# Patient Record
Sex: Male | Born: 1945
Health system: Southern US, Community
[De-identification: ages and names within clinical notes are randomized; demographics above are authoritative.]

## PROBLEM LIST (undated history)

## (undated) DIAGNOSIS — F419 Anxiety disorder, unspecified: Secondary | ICD-10-CM

## (undated) DIAGNOSIS — J309 Allergic rhinitis, unspecified: Secondary | ICD-10-CM

## (undated) DIAGNOSIS — E785 Hyperlipidemia, unspecified: Secondary | ICD-10-CM

## (undated) DIAGNOSIS — H409 Unspecified glaucoma: Secondary | ICD-10-CM

## (undated) DIAGNOSIS — C61 Malignant neoplasm of prostate: Secondary | ICD-10-CM

## (undated) DIAGNOSIS — I10 Essential (primary) hypertension: Secondary | ICD-10-CM

## (undated) DIAGNOSIS — K219 Gastro-esophageal reflux disease without esophagitis: Secondary | ICD-10-CM

## (undated) HISTORY — PX: TONSILLECTOMY: SUR1361

## (undated) HISTORY — DX: Hyperlipidemia, unspecified: E78.5

## (undated) HISTORY — PX: EYE SURGERY: SHX253

## (undated) HISTORY — DX: Essential (primary) hypertension: I10

## (undated) HISTORY — DX: Unspecified glaucoma: H40.9

## (undated) HISTORY — DX: Malignant neoplasm of prostate: C61

## (undated) HISTORY — PX: VASECTOMY: SHX75

## (undated) HISTORY — DX: Gastro-esophageal reflux disease without esophagitis: K21.9

## (undated) HISTORY — DX: Allergic rhinitis, unspecified: J30.9

---

## 2011-11-27 DIAGNOSIS — H40019 Open angle with borderline findings, low risk, unspecified eye: Secondary | ICD-10-CM | POA: Diagnosis not present

## 2012-01-06 DIAGNOSIS — L218 Other seborrheic dermatitis: Secondary | ICD-10-CM | POA: Diagnosis not present

## 2012-01-06 DIAGNOSIS — L821 Other seborrheic keratosis: Secondary | ICD-10-CM | POA: Diagnosis not present

## 2012-01-06 DIAGNOSIS — D1801 Hemangioma of skin and subcutaneous tissue: Secondary | ICD-10-CM | POA: Diagnosis not present

## 2012-01-06 DIAGNOSIS — L570 Actinic keratosis: Secondary | ICD-10-CM | POA: Diagnosis not present

## 2012-01-06 DIAGNOSIS — L819 Disorder of pigmentation, unspecified: Secondary | ICD-10-CM | POA: Diagnosis not present

## 2012-01-06 DIAGNOSIS — D239 Other benign neoplasm of skin, unspecified: Secondary | ICD-10-CM | POA: Diagnosis not present

## 2012-01-13 DIAGNOSIS — Z0000 Encounter for general adult medical examination without abnormal findings: Secondary | ICD-10-CM | POA: Diagnosis not present

## 2012-01-13 DIAGNOSIS — Z789 Other specified health status: Secondary | ICD-10-CM | POA: Diagnosis not present

## 2012-01-13 DIAGNOSIS — E785 Hyperlipidemia, unspecified: Secondary | ICD-10-CM | POA: Diagnosis not present

## 2012-01-13 DIAGNOSIS — K219 Gastro-esophageal reflux disease without esophagitis: Secondary | ICD-10-CM | POA: Diagnosis not present

## 2012-01-13 DIAGNOSIS — Z713 Dietary counseling and surveillance: Secondary | ICD-10-CM | POA: Diagnosis not present

## 2012-02-03 DIAGNOSIS — Z23 Encounter for immunization: Secondary | ICD-10-CM | POA: Diagnosis not present

## 2012-04-14 DIAGNOSIS — Z86010 Personal history of colonic polyps: Secondary | ICD-10-CM | POA: Diagnosis not present

## 2012-04-14 DIAGNOSIS — K5730 Diverticulosis of large intestine without perforation or abscess without bleeding: Secondary | ICD-10-CM | POA: Diagnosis not present

## 2012-04-14 DIAGNOSIS — D126 Benign neoplasm of colon, unspecified: Secondary | ICD-10-CM | POA: Diagnosis not present

## 2012-04-14 DIAGNOSIS — Z1211 Encounter for screening for malignant neoplasm of colon: Secondary | ICD-10-CM | POA: Diagnosis not present

## 2012-05-25 DIAGNOSIS — E785 Hyperlipidemia, unspecified: Secondary | ICD-10-CM | POA: Diagnosis not present

## 2012-05-25 DIAGNOSIS — R030 Elevated blood-pressure reading, without diagnosis of hypertension: Secondary | ICD-10-CM | POA: Diagnosis not present

## 2012-07-02 DIAGNOSIS — C61 Malignant neoplasm of prostate: Secondary | ICD-10-CM | POA: Diagnosis not present

## 2012-07-02 DIAGNOSIS — E785 Hyperlipidemia, unspecified: Secondary | ICD-10-CM | POA: Diagnosis not present

## 2012-07-13 DIAGNOSIS — E785 Hyperlipidemia, unspecified: Secondary | ICD-10-CM | POA: Diagnosis not present

## 2012-07-13 DIAGNOSIS — M1990 Unspecified osteoarthritis, unspecified site: Secondary | ICD-10-CM | POA: Diagnosis not present

## 2012-07-13 DIAGNOSIS — Z789 Other specified health status: Secondary | ICD-10-CM | POA: Diagnosis not present

## 2012-07-13 DIAGNOSIS — K219 Gastro-esophageal reflux disease without esophagitis: Secondary | ICD-10-CM | POA: Diagnosis not present

## 2012-07-13 DIAGNOSIS — Z713 Dietary counseling and surveillance: Secondary | ICD-10-CM | POA: Diagnosis not present

## 2012-08-06 DIAGNOSIS — R030 Elevated blood-pressure reading, without diagnosis of hypertension: Secondary | ICD-10-CM | POA: Diagnosis not present

## 2012-08-11 DIAGNOSIS — Z789 Other specified health status: Secondary | ICD-10-CM | POA: Diagnosis not present

## 2012-08-11 DIAGNOSIS — R030 Elevated blood-pressure reading, without diagnosis of hypertension: Secondary | ICD-10-CM | POA: Diagnosis not present

## 2012-08-11 DIAGNOSIS — Z713 Dietary counseling and surveillance: Secondary | ICD-10-CM | POA: Diagnosis not present

## 2012-09-29 DIAGNOSIS — E785 Hyperlipidemia, unspecified: Secondary | ICD-10-CM | POA: Diagnosis not present

## 2012-09-29 DIAGNOSIS — I10 Essential (primary) hypertension: Secondary | ICD-10-CM | POA: Diagnosis not present

## 2012-10-13 DIAGNOSIS — H6120 Impacted cerumen, unspecified ear: Secondary | ICD-10-CM | POA: Diagnosis not present

## 2013-01-04 DIAGNOSIS — Z23 Encounter for immunization: Secondary | ICD-10-CM | POA: Diagnosis not present

## 2013-01-14 DIAGNOSIS — R030 Elevated blood-pressure reading, without diagnosis of hypertension: Secondary | ICD-10-CM | POA: Diagnosis not present

## 2013-01-14 DIAGNOSIS — C61 Malignant neoplasm of prostate: Secondary | ICD-10-CM | POA: Diagnosis not present

## 2013-01-18 DIAGNOSIS — I10 Essential (primary) hypertension: Secondary | ICD-10-CM | POA: Diagnosis not present

## 2013-01-18 DIAGNOSIS — K219 Gastro-esophageal reflux disease without esophagitis: Secondary | ICD-10-CM | POA: Diagnosis not present

## 2013-01-18 DIAGNOSIS — E785 Hyperlipidemia, unspecified: Secondary | ICD-10-CM | POA: Diagnosis not present

## 2013-01-18 DIAGNOSIS — Z0000 Encounter for general adult medical examination without abnormal findings: Secondary | ICD-10-CM | POA: Diagnosis not present

## 2013-01-27 DIAGNOSIS — H40019 Open angle with borderline findings, low risk, unspecified eye: Secondary | ICD-10-CM | POA: Diagnosis not present

## 2013-05-19 DIAGNOSIS — E785 Hyperlipidemia, unspecified: Secondary | ICD-10-CM | POA: Diagnosis not present

## 2013-05-19 DIAGNOSIS — K219 Gastro-esophageal reflux disease without esophagitis: Secondary | ICD-10-CM | POA: Diagnosis not present

## 2013-05-19 DIAGNOSIS — I10 Essential (primary) hypertension: Secondary | ICD-10-CM | POA: Diagnosis not present

## 2013-07-28 DIAGNOSIS — H35039 Hypertensive retinopathy, unspecified eye: Secondary | ICD-10-CM | POA: Diagnosis not present

## 2013-07-28 DIAGNOSIS — H40019 Open angle with borderline findings, low risk, unspecified eye: Secondary | ICD-10-CM | POA: Diagnosis not present

## 2013-07-28 DIAGNOSIS — H2510 Age-related nuclear cataract, unspecified eye: Secondary | ICD-10-CM | POA: Diagnosis not present

## 2013-07-28 DIAGNOSIS — H43819 Vitreous degeneration, unspecified eye: Secondary | ICD-10-CM | POA: Diagnosis not present

## 2013-08-02 DIAGNOSIS — D239 Other benign neoplasm of skin, unspecified: Secondary | ICD-10-CM | POA: Diagnosis not present

## 2013-08-02 DIAGNOSIS — L570 Actinic keratosis: Secondary | ICD-10-CM | POA: Diagnosis not present

## 2013-08-02 DIAGNOSIS — L819 Disorder of pigmentation, unspecified: Secondary | ICD-10-CM | POA: Diagnosis not present

## 2013-08-02 DIAGNOSIS — L821 Other seborrheic keratosis: Secondary | ICD-10-CM | POA: Diagnosis not present

## 2013-08-02 DIAGNOSIS — L218 Other seborrheic dermatitis: Secondary | ICD-10-CM | POA: Diagnosis not present

## 2013-08-02 DIAGNOSIS — D1801 Hemangioma of skin and subcutaneous tissue: Secondary | ICD-10-CM | POA: Diagnosis not present

## 2013-08-18 DIAGNOSIS — I10 Essential (primary) hypertension: Secondary | ICD-10-CM | POA: Diagnosis not present

## 2013-08-18 DIAGNOSIS — E785 Hyperlipidemia, unspecified: Secondary | ICD-10-CM | POA: Diagnosis not present

## 2013-08-18 DIAGNOSIS — C61 Malignant neoplasm of prostate: Secondary | ICD-10-CM | POA: Diagnosis not present

## 2013-08-18 DIAGNOSIS — K219 Gastro-esophageal reflux disease without esophagitis: Secondary | ICD-10-CM | POA: Diagnosis not present

## 2013-08-30 DIAGNOSIS — H40019 Open angle with borderline findings, low risk, unspecified eye: Secondary | ICD-10-CM | POA: Diagnosis not present

## 2013-08-30 DIAGNOSIS — H43819 Vitreous degeneration, unspecified eye: Secondary | ICD-10-CM | POA: Diagnosis not present

## 2013-08-30 DIAGNOSIS — H2510 Age-related nuclear cataract, unspecified eye: Secondary | ICD-10-CM | POA: Diagnosis not present

## 2013-08-30 DIAGNOSIS — H35039 Hypertensive retinopathy, unspecified eye: Secondary | ICD-10-CM | POA: Diagnosis not present

## 2013-10-24 DIAGNOSIS — H4011X0 Primary open-angle glaucoma, stage unspecified: Secondary | ICD-10-CM | POA: Diagnosis not present

## 2013-10-24 DIAGNOSIS — H409 Unspecified glaucoma: Secondary | ICD-10-CM | POA: Diagnosis not present

## 2013-11-17 DIAGNOSIS — Z23 Encounter for immunization: Secondary | ICD-10-CM | POA: Diagnosis not present

## 2013-11-17 DIAGNOSIS — E782 Mixed hyperlipidemia: Secondary | ICD-10-CM | POA: Diagnosis not present

## 2013-11-17 DIAGNOSIS — K219 Gastro-esophageal reflux disease without esophagitis: Secondary | ICD-10-CM | POA: Diagnosis not present

## 2013-11-17 DIAGNOSIS — I10 Essential (primary) hypertension: Secondary | ICD-10-CM | POA: Diagnosis not present

## 2013-11-17 DIAGNOSIS — H409 Unspecified glaucoma: Secondary | ICD-10-CM | POA: Diagnosis not present

## 2013-11-17 DIAGNOSIS — C61 Malignant neoplasm of prostate: Secondary | ICD-10-CM | POA: Diagnosis not present

## 2014-01-31 DIAGNOSIS — Z23 Encounter for immunization: Secondary | ICD-10-CM | POA: Diagnosis not present

## 2014-03-01 DIAGNOSIS — H40033 Anatomical narrow angle, bilateral: Secondary | ICD-10-CM | POA: Diagnosis not present

## 2014-03-16 ENCOUNTER — Ambulatory Visit: Payer: Self-pay | Admitting: Cardiology

## 2014-03-20 ENCOUNTER — Ambulatory Visit: Payer: Self-pay | Admitting: Cardiology

## 2014-04-03 ENCOUNTER — Encounter: Payer: Self-pay | Admitting: Cardiology

## 2014-04-03 ENCOUNTER — Ambulatory Visit (INDEPENDENT_AMBULATORY_CARE_PROVIDER_SITE_OTHER): Payer: Medicare Other | Admitting: Cardiology

## 2014-04-03 VITALS — BP 140/80 | HR 93 | Ht 68.0 in | Wt 151.8 lb

## 2014-04-03 DIAGNOSIS — J309 Allergic rhinitis, unspecified: Secondary | ICD-10-CM | POA: Insufficient documentation

## 2014-04-03 DIAGNOSIS — I10 Essential (primary) hypertension: Secondary | ICD-10-CM | POA: Diagnosis not present

## 2014-04-03 DIAGNOSIS — C61 Malignant neoplasm of prostate: Secondary | ICD-10-CM | POA: Insufficient documentation

## 2014-04-03 DIAGNOSIS — H409 Unspecified glaucoma: Secondary | ICD-10-CM | POA: Insufficient documentation

## 2014-04-03 DIAGNOSIS — E785 Hyperlipidemia, unspecified: Secondary | ICD-10-CM | POA: Insufficient documentation

## 2014-04-03 DIAGNOSIS — R9431 Abnormal electrocardiogram [ECG] [EKG]: Secondary | ICD-10-CM

## 2014-04-03 DIAGNOSIS — K219 Gastro-esophageal reflux disease without esophagitis: Secondary | ICD-10-CM | POA: Insufficient documentation

## 2014-04-03 NOTE — Patient Instructions (Addendum)
Your physician has requested that you have a Stress Myoview.   Your physician has requested that you have an echocardiogram. Echocardiography is a painless test that uses sound waves to create images of your heart. It provides your doctor with information about the size and shape of your heart and how well your heart's chambers and valves are working. This procedure takes approximately one hour. There are no restrictions for this procedure.  Your physician recommends that you continue on your current medications as directed. Please refer to the Current Medication list given to you today.  Your physician recommends that you schedule a follow-up appointment AS NEEDED with Dr. Radford Pax.

## 2014-04-03 NOTE — Progress Notes (Signed)
  [place], [place], [place]  [address] Phone: [phone] Fax:  [phone]  Date:  04/03/2014   ID:  Austin Guerrero, DOB 1946-03-01, MRN [id number]  PCP:  Dr. Theadore Nan Cardiologist:  Fransico Him, MD    History of Present Illness: Austin Guerrero is a 68 y.o. male with a history of HTN and dyslipidemia who presents today for evaluation of cardiac risk factors.  His mother had tachycardia and pericardial effusion and CHF and his dad had HTN.  There is no family history of heart attacks that he is aware of.  He denies any chest pain, SOB, DOE, LE edema, dizziness, palpitations or syncope.     Wt Readings from Last 3 Encounters:  04/03/14 151 lb 12.8 oz (68.856 kg)     Past Medical History  Diagnosis Date  . Hypertension   . Hyperlipidemia   . Prostate cancer   . GERD (gastroesophageal reflux disease)   . Allergic rhinitis   . Glaucoma     Current Outpatient Prescriptions  Medication Sig Dispense Refill  . aspirin 81 MG tablet Take 81 mg by mouth daily.    Marland Kitchen atorvastatin (LIPITOR) 20 MG tablet     . Calcium Carbonate-Vitamin D (CALCIUM 600+D) 600-400 MG-UNIT per tablet Take 1 tablet by mouth daily.    . hydrochlorothiazide (HYDRODIURIL) 25 MG tablet     . Multiple Vitamin (MULTIVITAMIN) tablet Take 1 tablet by mouth daily.    Marland Kitchen omeprazole (PRILOSEC) 20 MG capsule     . desloratadine (CLARINEX) 5 MG tablet Take 5 mg by mouth as needed.     No current facility-administered medications for this visit.    Allergies:   No Known Allergies  Social History:  The patient  reports that he has never smoked. He does not have any smokeless tobacco history on file. He reports that he drinks alcohol. He reports that he does not use illicit drugs.   Family History:  The patient's family history includes Arrhythmia in his father; Atrial fibrillation in his mother; Cancer - Lung in his father; Heart failure in his mother; Hyperlipidemia in his brother; Hypertension in his  brother; Prostate cancer in his father.   ROS:  Please see the history of present illness.      All other systems reviewed and negative.   PHYSICAL EXAM: VS:  BP 140/80 mmHg  Pulse 93  Ht 5\' 8"  (1.727 m)  Wt 151 lb 12.8 oz (68.856 kg)  BMI 23.09 kg/m2 Well nourished, well developed, in no acute distress HEENT: normal Neck: no JVD Cardiac:  normal S1, S2; RRR; no murmur Lungs:  clear to auscultation bilaterally, no wheezing, rhonchi or rales Abd: soft, nontender, no hepatomegaly Ext: no edema Skin: warm and dry Neuro:  CNs 2-12 intact, no focal abnormalities noted  EKG:     NSR with possible inferolateral infarct and nonspecific T wave abnormality  ASSESSMENT AND PLAN:  1. HTN well controlled 2. Dyslipidemia 3. Abnormal EKG with inferolateral infarct pattern.  He has a Family history of CHF and arrhythmia.  He has no evidence of CHF on exam and has no symptoms of CHF.  There is no family history of CAD that he is aware of.  I will get a stress myoview to rule out ischemia and a 2D echo to assess LVF and rule out RWMA's.  Followup PRN pending results of studies  Signed, Fransico Him, MD [facility] 04/03/2014 2:34 PM

## 2014-04-04 ENCOUNTER — Telehealth: Payer: Self-pay | Admitting: Cardiology

## 2014-04-04 NOTE — Telephone Encounter (Signed)
Patient complaining because he has received 4 calls from our office today. He explained that 2 were reminders for upcoming appointments, one was a person calling from nuc med giving instructions for stress test, and one was me.  Instructed the patient that reminder calls are made for all appointments, and it is unfortunate that his appointments are happening so close because he will receive multiple calls for reminders and instruction. Also instructed the patient that I called simply because he asked for a return call.  Apologized for any frustration caused and also warned the patient that another reminder call may be made for his ECHO appointment Friday.

## 2014-04-04 NOTE — Telephone Encounter (Signed)
New Msg  Please contact pt at [phone]. Patient returning call. Sates he's been called multiple times today and isn't sure why. Informed pt of two upcoming appts but,  pt states he feels more comfortable with nurse calling to make sure there are no other concerns.

## 2014-04-05 ENCOUNTER — Ambulatory Visit (HOSPITAL_COMMUNITY): Payer: Medicare Other | Attending: Cardiology | Admitting: Radiology

## 2014-04-05 DIAGNOSIS — R9431 Abnormal electrocardiogram [ECG] [EKG]: Secondary | ICD-10-CM | POA: Insufficient documentation

## 2014-04-05 DIAGNOSIS — R002 Palpitations: Secondary | ICD-10-CM | POA: Insufficient documentation

## 2014-04-05 DIAGNOSIS — I10 Essential (primary) hypertension: Secondary | ICD-10-CM | POA: Diagnosis not present

## 2014-04-05 MED ORDER — TECHNETIUM TC 99M SESTAMIBI GENERIC - CARDIOLITE
11.0000 | Freq: Once | INTRAVENOUS | Status: AC | PRN
  Administered 2014-04-05: 11 via INTRAVENOUS

## 2014-04-05 MED ORDER — TECHNETIUM TC 99M SESTAMIBI GENERIC - CARDIOLITE
33.0000 | Freq: Once | INTRAVENOUS | Status: AC | PRN
  Administered 2014-04-05: 33 via INTRAVENOUS

## 2014-04-05 NOTE — Progress Notes (Signed)
[facility] [address] [place], [place] [address] [phone]    /Cardiology Nuclear Med Study  Austin Guerrero is a 68 y.o. male     MRN : [id number]     DOB: 02-04-1946  Procedure Date: 04/05/2014  Nuclear Med Background Indication for Stress Test:  Evaluation for Ischemia and Abnormal EKG History:  n/a Cardiac Risk Factors: Hypertension  Symptoms:  Palpitations   Nuclear Pre-Procedure Caffeine/Decaff Intake:  None> 12 hrs NPO After: 8:00pm   Lungs:  clear O2 Sat: 97% on room air. IV 0.9% NS with Angio Cath:  22g  IV Site: R Antecubital x 1, tolerated well IV Started by:  Irven Baltimore, RN  Chest Size (in):  38 Cup Size: n/a  Height: 5\' 8"  (1.727 m)  Weight:  148 lb (67.132 kg)  BMI:  Body mass index is 22.51 kg/(m^2). Tech Comments:  N/A    Nuclear Med Study 1 or 2 day study: 1 day  Stress Test Type:  Stress  Reading MD: N/A  Order Authorizing Provider:  Fransico Him, PAC  Resting Radionuclide: Technetium 73m Sestamibi  Resting Radionuclide Dose: 11.0 mCi   Stress Radionuclide:  Technetium 69m Sestamibi  Stress Radionuclide Dose: 33.0 mCi           Stress Protocol Rest HR: 62 Stress HR: 151  Rest BP: 151/82 Stress BP: 197/87  Exercise Time (min): 9:00 METS: 10.10   Predicted Max HR: 150 bpm % Max HR: 100.67 bpm Rate Pressure Product: [phone]   Dose of Adenosine (mg):  n/a Dose of Lexiscan: n/a mg  Dose of Atropine (mg): n/a Dose of Dobutamine: n/a mcg/kg/min (at max HR)  Stress Test Technologist: Perrin Maltese, EMT-P  Nuclear Technologist:  Earl Many, CNMT     Rest Procedure:  Myocardial perfusion imaging was performed at rest 45 minutes following the intravenous administration of Technetium 84m Sestamibi. Rest ECG: Normal sinus rhythm. Normal EKG.  Stress Procedure:  The patient exercised on the treadmill utilizing the Bruce Protocol for 9:00 minutes. The patient stopped due to fatigue and denied any chest pain.   Technetium 23m Sestamibi was injected at peak exercise and myocardial perfusion imaging was performed after a brief delay. Stress ECG: No significant change from baseline ECG  QPS Raw Data Images:  Normal; no motion artifact; normal heart/lung ratio. Stress Images:  Normal homogeneous uptake in all areas of the myocardium. Rest Images:  Normal homogeneous uptake in all areas of the myocardium. Subtraction (SDS):  No evidence of ischemia. Transient Ischemic Dilatation (Normal <1.22):  0.93 Lung/Heart Ratio (Normal <0.45):  0.30  Quantitative Gated Spect Images QGS EDV:  81 ml QGS ESV:  30 ml  Impression Exercise Capacity:  Very good exercise tolerance. BP Response:  Normal blood pressure response. Clinical Symptoms:  Fatigue ECG Impression:  No significant ST segment change suggestive of ischemia. Comparison with Prior Nuclear Study: No images to compare  Overall Impression:  Normal stress nuclear study. This is a low risk scan. There is no scar or ischemia. There is very good exercise tolerance.  LV Ejection Fraction: 63%.  LV Wall Motion:  Normal Wall Motion.  [name]

## 2014-04-07 ENCOUNTER — Ambulatory Visit (HOSPITAL_COMMUNITY): Payer: Medicare Other | Attending: Cardiology | Admitting: Radiology

## 2014-04-07 DIAGNOSIS — R9431 Abnormal electrocardiogram [ECG] [EKG]: Secondary | ICD-10-CM | POA: Diagnosis not present

## 2014-04-07 NOTE — Progress Notes (Signed)
Echocardiogram performed.  

## 2014-04-13 ENCOUNTER — Telehealth: Payer: Self-pay | Admitting: Cardiology

## 2014-04-13 DIAGNOSIS — H40033 Anatomical narrow angle, bilateral: Secondary | ICD-10-CM | POA: Diagnosis not present

## 2014-04-13 NOTE — Telephone Encounter (Signed)
New message ° ° °Pt calling back for echo results  °

## 2014-04-13 NOTE — Telephone Encounter (Signed)
F/U  Pt returning call about echo results please contact at [phone].

## 2014-04-13 NOTE — Telephone Encounter (Signed)
I attempted to call the patient back. I left him a message we will try back tomorrow.

## 2014-04-14 NOTE — Telephone Encounter (Signed)
Follow up ° ° ° ° ° °Calling to get test results °

## 2014-04-14 NOTE — Telephone Encounter (Signed)
I spoke with the patient. 

## 2014-05-19 DIAGNOSIS — C61 Malignant neoplasm of prostate: Secondary | ICD-10-CM | POA: Diagnosis not present

## 2014-05-19 DIAGNOSIS — I10 Essential (primary) hypertension: Secondary | ICD-10-CM | POA: Diagnosis not present

## 2014-05-19 DIAGNOSIS — J309 Allergic rhinitis, unspecified: Secondary | ICD-10-CM | POA: Diagnosis not present

## 2014-05-19 DIAGNOSIS — H409 Unspecified glaucoma: Secondary | ICD-10-CM | POA: Diagnosis not present

## 2014-05-19 DIAGNOSIS — E785 Hyperlipidemia, unspecified: Secondary | ICD-10-CM | POA: Diagnosis not present

## 2014-05-22 DIAGNOSIS — J309 Allergic rhinitis, unspecified: Secondary | ICD-10-CM | POA: Diagnosis not present

## 2014-05-22 DIAGNOSIS — C61 Malignant neoplasm of prostate: Secondary | ICD-10-CM | POA: Diagnosis not present

## 2014-05-22 DIAGNOSIS — R972 Elevated prostate specific antigen [PSA]: Secondary | ICD-10-CM | POA: Diagnosis not present

## 2014-05-22 DIAGNOSIS — I10 Essential (primary) hypertension: Secondary | ICD-10-CM | POA: Diagnosis not present

## 2014-05-22 DIAGNOSIS — E785 Hyperlipidemia, unspecified: Secondary | ICD-10-CM | POA: Diagnosis not present

## 2014-05-22 DIAGNOSIS — Z0000 Encounter for general adult medical examination without abnormal findings: Secondary | ICD-10-CM | POA: Diagnosis not present

## 2014-05-22 DIAGNOSIS — H409 Unspecified glaucoma: Secondary | ICD-10-CM | POA: Diagnosis not present

## 2014-07-13 DIAGNOSIS — H40009 Preglaucoma, unspecified, unspecified eye: Secondary | ICD-10-CM | POA: Diagnosis not present

## 2014-07-20 DIAGNOSIS — H4011X1 Primary open-angle glaucoma, mild stage: Secondary | ICD-10-CM | POA: Diagnosis not present

## 2014-08-22 DIAGNOSIS — Z0000 Encounter for general adult medical examination without abnormal findings: Secondary | ICD-10-CM | POA: Diagnosis not present

## 2014-08-22 DIAGNOSIS — R972 Elevated prostate specific antigen [PSA]: Secondary | ICD-10-CM | POA: Diagnosis not present

## 2014-08-22 DIAGNOSIS — J309 Allergic rhinitis, unspecified: Secondary | ICD-10-CM | POA: Diagnosis not present

## 2014-08-22 DIAGNOSIS — I10 Essential (primary) hypertension: Secondary | ICD-10-CM | POA: Diagnosis not present

## 2014-08-22 DIAGNOSIS — C61 Malignant neoplasm of prostate: Secondary | ICD-10-CM | POA: Diagnosis not present

## 2014-08-22 DIAGNOSIS — H409 Unspecified glaucoma: Secondary | ICD-10-CM | POA: Diagnosis not present

## 2014-08-22 DIAGNOSIS — E785 Hyperlipidemia, unspecified: Secondary | ICD-10-CM | POA: Diagnosis not present

## 2014-10-05 DIAGNOSIS — D225 Melanocytic nevi of trunk: Secondary | ICD-10-CM | POA: Diagnosis not present

## 2014-10-05 DIAGNOSIS — D1801 Hemangioma of skin and subcutaneous tissue: Secondary | ICD-10-CM | POA: Diagnosis not present

## 2014-10-05 DIAGNOSIS — L821 Other seborrheic keratosis: Secondary | ICD-10-CM | POA: Diagnosis not present

## 2014-10-05 DIAGNOSIS — L812 Freckles: Secondary | ICD-10-CM | POA: Diagnosis not present

## 2014-10-05 DIAGNOSIS — L570 Actinic keratosis: Secondary | ICD-10-CM | POA: Diagnosis not present

## 2014-10-05 DIAGNOSIS — Z1283 Encounter for screening for malignant neoplasm of skin: Secondary | ICD-10-CM | POA: Diagnosis not present

## 2014-12-19 DIAGNOSIS — C61 Malignant neoplasm of prostate: Secondary | ICD-10-CM | POA: Diagnosis not present

## 2015-01-03 ENCOUNTER — Other Ambulatory Visit (HOSPITAL_COMMUNITY): Payer: Self-pay | Admitting: Urology

## 2015-01-03 DIAGNOSIS — C61 Malignant neoplasm of prostate: Secondary | ICD-10-CM

## 2015-01-16 DIAGNOSIS — H4011X1 Primary open-angle glaucoma, mild stage: Secondary | ICD-10-CM | POA: Diagnosis not present

## 2015-01-29 DIAGNOSIS — Z23 Encounter for immunization: Secondary | ICD-10-CM | POA: Diagnosis not present

## 2015-02-09 ENCOUNTER — Ambulatory Visit (HOSPITAL_COMMUNITY)
Admission: RE | Admit: 2015-02-09 | Discharge: 2015-02-09 | Disposition: A | Discharge status: Home / Self Care | Payer: Medicare Other | Source: Ambulatory Visit | Attending: Urology | Admitting: Urology

## 2015-02-09 DIAGNOSIS — C61 Malignant neoplasm of prostate: Secondary | ICD-10-CM | POA: Diagnosis not present

## 2015-02-09 LAB — POCT I-STAT CREATININE: CREATININE: 0.9 mg/dL (ref 0.61–1.24)

## 2015-02-09 MED ORDER — GADOBENATE DIMEGLUMINE 529 MG/ML IV SOLN
15.0000 mL | Freq: Once | INTRAVENOUS | Status: AC | PRN
  Administered 2015-02-09: 15 mL via INTRAVENOUS

## 2015-02-21 DIAGNOSIS — C61 Malignant neoplasm of prostate: Secondary | ICD-10-CM

## 2015-02-21 HISTORY — DX: Malignant neoplasm of prostate: C61

## 2015-02-24 ENCOUNTER — Encounter: Payer: Self-pay | Admitting: Radiation Oncology

## 2015-02-24 NOTE — Progress Notes (Signed)
GU Location of Tumor / Histology: Adenocarcinoma of the Prostate    If Prostate Cancer, Gleason Score is (3 + 3) and PSA (7.10)  Austin Guerrero presented with a rising PSA  Past/Anticipated interventions by urology, if any: Biopsy of Prostate  Past/Anticipated interventions by medical oncology, if any: Unknown  Weight changes, if any: No  Bowel/Bladder complaints, if any: Weak Stream, rare nocturia   Nausea/Vomiting, if any: No  Pain issues, if any:  No  SAFETY ISSUES:  Prior radiation? No  Pacemaker/ICD? No  Possible current pregnancy? No  Is the patient on methotrexate? No  Current Complaints / other details:  Lack of Information

## 2015-02-26 ENCOUNTER — Telehealth: Payer: Self-pay | Admitting: Medical Oncology

## 2015-02-26 NOTE — Telephone Encounter (Signed)
I called pt to introduce myself as the Prostate Nurse Navigator and the Coordinator of the Prostate [place].  1. I confirmed with the patient he is aware of his referral to the clinic 02/27/15 arriving at 12:30pm.  2. I discussed the format of the clinic and the physicians he will be seeing that day.  3. I discussed where the clinic is located and how to contact me.  Due to the late referral to the clinic, I will have Austin Guerrero complete his medical forms in the clinic tomorrow.   He voiced understanding of the above. I asked him to call me if he has any questions or concerns.

## 2015-02-27 ENCOUNTER — Encounter: Payer: Self-pay | Admitting: Medical Oncology

## 2015-02-27 ENCOUNTER — Encounter: Payer: Self-pay | Admitting: Adult Health

## 2015-02-27 ENCOUNTER — Encounter: Payer: Self-pay | Admitting: General Practice

## 2015-02-27 ENCOUNTER — Ambulatory Visit (HOSPITAL_BASED_OUTPATIENT_CLINIC_OR_DEPARTMENT_OTHER): Payer: Medicare Other | Admitting: Oncology

## 2015-02-27 ENCOUNTER — Ambulatory Visit
Admission: RE | Admit: 2015-02-27 | Discharge: 2015-02-27 | Disposition: A | Discharge status: Home / Self Care | Payer: Medicare Other | Source: Ambulatory Visit | Attending: Radiation Oncology | Admitting: Radiation Oncology

## 2015-02-27 ENCOUNTER — Encounter: Payer: Self-pay | Admitting: Radiation Oncology

## 2015-02-27 VITALS — BP 145/78 | HR 71 | Temp 98.0°F | Ht 66.0 in | Wt 161.0 lb

## 2015-02-27 DIAGNOSIS — C61 Malignant neoplasm of prostate: Secondary | ICD-10-CM

## 2015-02-27 NOTE — Progress Notes (Addendum)
[facility] NEW PATIENT EVALUATION  Name: Austin Guerrero MRN: [id number]  Date:   02/27/2015           DOB: 07/03/1945  Status: outpatient   CC: Dr. Cari Caraway, Dr. Dutch Gray   REFERRING PHYSICIAN: Dr. Dutch Gray  DIAGNOSIS: Stage T1c intermediate risk adenocarcinoma prostate   HISTORY OF PRESENT ILLNESS:  Austin Guerrero is a 69 y.o. male who is seen today at the prostate multidisciplinary clinic for evaluation of his stage T1c intermediate risk adenocarcinoma prostate.  He was initially diagnosed with prostate cancer in July 2012 after being found to have an elevated PSA of 5.9 on 10/11/2010.  He was seen at [facility] and underwent ultrasound-guided biopsies on 11/25/2010.  This showed Gleason 6 (3+3) in 2 of 12 biopsy cores including 20% of one core at the left apex and 33% of another core at the right apex.  He elected for active surveillance.  However, he was simply followed by PSA blood test by his primary care physician at the time.  His PSA was 3.6 in December 2012, 5.4 in September 2013, 7.05 in January 2016, 7.08 in April 2016, and most recently 7.1 on 12/19/2014.   He moved to [facility] and has been followed by Dr. Theadore Nan.  She wanted consultation with urology, therefore he was seen by Dr. Alinda Money.  Dr. Alinda Money obtained a prostate MR scan on 02/09/2015 which showed a suspicious mass within the left anterior apical region measuring approximately 0.9 x 0.5 cm.  There was no gross trans-capsular spread seen.  The suspicious lesion likely contacted the capsule throughout nearly 1.0 cm.  Repeat biopsies on 02/21/2015 showed Gleason 7 (3+4) involving 70% of one core from the left apex (pattern 4 equal 20%).  He was also found to have Gleason 6 (3+3) involving 80% of a second core from the left apex.  Remaining biopsies were benign.  His prostate volume was 29.4 mL.  He is doing well from a GU and GI standpoint.  His I PSS score is 7.  He does have erectile  dysfunction and this is not a major concern for him.  PREVIOUS RADIATION THERAPY: No   PAST MEDICAL HISTORY:  has a past medical history of Hypertension; Hyperlipidemia; Prostate cancer ([place]); GERD (gastroesophageal reflux disease); Allergic rhinitis; Glaucoma; Prostate cancer ([place]) (02/21/15); and Glaucoma.     PAST SURGICAL HISTORY:  Past Surgical History  Procedure Laterality Date  . Tonsillectomy    . Vasectomy       FAMILY HISTORY: family history includes Arrhythmia in his father; Atrial fibrillation in his mother; Cancer - Lung in his father; Heart failure in his mother; Hyperlipidemia in his brother; Hypertension in his brother; Prostate cancer in his father.  His father was diagnosed with prostate cancer late in life and died from complications of prostate cancer at age 80.  His mother died at age 43 from complications of surgery.   SOCIAL HISTORY:  reports that he has never smoked. He does not have any smokeless tobacco history on file. He reports that he drinks alcohol. He reports that he does not use illicit drugs.  Married, one child.  Retired Optometrist for [employer].   ALLERGIES: Review of patient's allergies indicates no known allergies.   MEDICATIONS:  Current Outpatient Prescriptions  Medication Sig Dispense Refill  . aspirin 81 MG tablet Take 81 mg by mouth daily.    Marland Kitchen atorvastatin (LIPITOR) 20 MG tablet     . Calcium  Carbonate-Vitamin D (CALCIUM 600+D) 600-400 MG-UNIT per tablet Take 1 tablet by mouth daily.    Marland Kitchen desloratadine (CLARINEX) 5 MG tablet Take 5 mg by mouth as needed.    . hydrochlorothiazide (HYDRODIURIL) 25 MG tablet     . Multiple Vitamin (MULTIVITAMIN) tablet Take 1 tablet by mouth daily.    Marland Kitchen omeprazole (PRILOSEC) 20 MG capsule      No current facility-administered medications for this encounter.     REVIEW OF SYSTEMS:  Pertinent items are noted in HPI.    PHYSICAL EXAM:  height is 5\' 6"  (1.676 m) and weight is 161 lb (73.029  kg). His temperature is 98 F (36.7 C). His blood pressure is 145/78 and his pulse is 71.   He is not examined today.  Dr. Alinda Money did not feel any suspicious nodularity or induration.   LABORATORY DATA:  No results found for: WBC, HGB, HCT, MCV, PLT No results found for: NA, K, CL, CO2 No results found for: ALT, AST, GGT, ALKPHOS, BILITOT  PSA 7.1 from 12/19/2014   IMPRESSION: Stage TIc intermediate risk adenocarcinoma prostate.  I explained to the patient and his wife that his prognosis is related to his Gleason score, PSA level, and stage.  His stage and PSA level are favorable while his Gleason score of 7 is of intermediate favorability.  Other prognostic factors include PSA doubling time and disease volume, and these are favorable.  Management options include robotic surgery versus continued active surveillance, or radiation therapy.  Radiation therapy options include 5 weeks of external beam followed by seed implant boost or 8 weeks of external beam/IMRT.  He is quite active, and physiologically, he is much younger than his age of 30.  We discussed the potential acute and late toxicities of radiation therapy.  After lengthy discussion, he would prefer external beam/IMRT should he choose radiation therapy.  He will think things over and get back in touch with me if he wants to proceed with radiation therapy.  He may want to wait until early next week before proceeding with treatment.   PLAN: As discussed above.  I spent 45  minutes face to face with the patient and more than 50% of that time was spent in counseling and/or coordination of care.

## 2015-02-27 NOTE — Progress Notes (Unsigned)
                               Care Plan Summary  Name: Mr. Kannan Proia DOB: [birth year]   Your Medical Team:   Urologist -  Dr. Raynelle Bring, [facility]  Radiation Oncologist - Dr. Arloa Koh, [facility]   Medical Oncologist - Dr. Zola Button, [facility] Recommendations: 1) Surgery  2) Radiation   * These recommendations are based on information available as of today's consult.      Recommendations may change depending on the results of further tests or exams. Next Steps: 1) When you make your final decision call Dr. Valere Dross is radiation or Cira Rue with treatment decision.    When appointments need to be scheduled, you will be contacted by [facility] and/or [facility].  Questions?  Please do not hesitate to call Cira Rue, RN, BSN, CRNI at [phone] any questions or concerns.  Shirlean Mylar is your Oncology Nurse Navigator and is available to assist you while you're receiving your medical care at [facility].

## 2015-02-27 NOTE — Progress Notes (Signed)
[facility] Psychosocial Distress Screening Spiritual Care  Met Austin Guerrero and his wife Austin Guerrero in [facility] to introduce [facility] team/resources, reviewing distress screen per protocol.  The patient scored a 1 on the Psychosocial Distress Thermometer which indicates mild distress. Also provided assess for distress and other psychosocial needs.   ONCBCN DISTRESS SCREENING 02/27/2015  Screening Type Initial Screening  Distress experienced in past week (1-10) 1  Emotional problem type Nervousness/Anxiety  Spiritual/Religous concerns type Facing my mortality  Referral to support programs Yes  Other Spiritual Care, Counseling Intern   Austin Guerrero and his wife enjoy traveling as part of enjoying retirement together.  This is a significant source of meaning and purpose in their lives; being in [place] over the weekend between dx and [facility]DC helped reduce distress, just as anticipating an upcoming trip to [facility] helps them focus on living instead of on health concerns or tx.  They were pleased to learn of [facility] team/resource availability.  They report no other concerns at this time.  Follow up needed: No.  Pt has complete [place] packet with contact details for further support as desired.  [place], [place], [facility] Pager [phone] Voicemail [phone]

## 2015-02-27 NOTE — Consult Note (Signed)
Chief Complaint  Prostate Cancer   Reason For Visit  Location of consult: Prostate Cancer Multidisciplinary Clinic   History of Present Illness  Mr. Liford is a pleasant 69 year old healthy gentleman who is a patient of Dr. Theadore Nan. He was initially diagnosed with prostate cancer in July 2012 after being found to have an elevated PSA of 5.9 on 10/11/10.  He was seen in consultation by Dr. Fransisco Hertz at [facility] and underwent a prostate needle biopsy on 11/25/10.  This demonstrated Gleason 3+3 = 6 adenocarcinoma in 2 out of 12 biopsy cores including 20% of one core at the left apex and 33% of another core at the right apex.  After counseling by Dr. Thomos Lemons, the patient elected to proceed with active surveillance.  However, since that time, he has simply been followed with PSA results through his primary care physician and has not had further urologic evaluation.  He has not undergone repeat biopsies and has yet to have a digital rectal exam over the past few years.  His PSA results have been 3.6 in December 2012, 5.4 in September 2013, 5.3 in September 2014, 7.05 in January 2016, and 7.08 on his most recent PSA on 08/22/14. He presented to me in August 2016 to establish care after moving to [facility].  He does have a paternal family history of prostate cancer with his father having been diagnosed in his 8s.  His father was apparently treated with androgen deprivation therapy and died of complications related to lung cancer.  Mr. Ricketson is exceedingly healthy overall.  He does have a history of hypertension and high cholesterol which are well managed with medical therapy.  He has no other major medical comorbidities.  Initial diagnosis: July 2012 TNM stage: cT1c Nx Mx PSA at diagnosis: 5.9  Gleason score: 3+3 = 6 Biopsy (11/25/10 - read by Dr. Rowe Clack, [facility]): 2/12 cores - L mid apex (20%), R mid apex (33%) Prostate volume: Unknown PSA density: Unknown  Baseline urinary  function: He states that he does have a weak stream consistently.  This is really his only symptom and is not particularly bothersome to him.  IPSS is 6.  His urinary function is not currently a bother to him at all. Baseline erectile function: He does have significant erectile dysfunction.  He is treatment nave and has not pursued any treatment options including medical therapy.  SHIM score is 2.  Interval history:  Mr. Cullars returns today to discuss his recent biopsy results in the multidisciplinary clinic.  He has recovered well from his biopsy last week.     Past Medical History  1. History of esophageal reflux (Z87.19)  2. History of glaucoma (Z86.69)  3. History of hyperlipidemia (Z86.39)  4. History of hypertension (Z86.79)  Surgical History  1. History of Surgery Vas Deferens Vasectomy  2. History of Tonsillectomy  Current Meds  1. Aspir-81 81 MG Oral Tablet Delayed Release;  Therapy: (Recorded:15Aug2016) to Recorded  2. Calcium + D TABS;  Therapy: (Recorded:15Aug2016) to Recorded  3. Clarinex 5 MG Oral Tablet;  Therapy: (Recorded:15Aug2016) to Recorded  4. Fish Oil Concentrate 1000 MG Oral Capsule;  Therapy: (Recorded:15Aug2016) to Recorded  5. Hydrochlorothiazide 25 MG Oral Tablet;  Therapy: (Recorded:15Aug2016) to Recorded  6. Hydrochlorothiazide 25 MG Oral Tablet;  Therapy: (Recorded:16Aug2016) to Recorded  7. Lipitor 20 MG Oral Tablet;  Therapy: (Recorded:15Aug2016) to Recorded  8. Multi-Vitamin TABS;  Therapy: (Recorded:15Aug2016) to Recorded  9. Omeprazole 20 MG Oral Tablet Delayed Release;  Therapy: (  Recorded:15Aug2016) to Recorded  10. Vitamin C 1000 MG Oral Tablet;   Therapy: (Recorded:15Aug2016) to Recorded  Allergies  1. No Known Drug Allergies  Family History  1. Family history of cardiac arrhythmia (Z82.49) : Father  2. Family history of congestive heart failure (Z82.49) : Mother  3. Family history of hypertension (Z82.49) : Father  4. Family  history of lung cancer (Z80.1) : Father  5. Family history of prostate cancer (Z80.42) : Father  6. Family history of Pericardial effusion : Mother  59. Family history of Tachycardia : Mother  Social History   Alcohol use (Z78.9)   Married   Never a smoker   Number of children   Retired  Physical Exam Constitutional: Well nourished and well developed . No acute distress.    Results/Data Selected Results  PSA 16Aug2016 09:21AM Raynelle Bring  SPECIMEN TYPE: BLOOD   Test Name Result Flag Reference  PSA 7.10 ng/mL H <=4.00  TEST METHODOLOGY: ECLIA PSA (ELECTROCHEMILUMINESCENCE IMMUNOASSAY)     His medical records, PSA results, MRI, and pathology slides have been reviewed today in the prostate cancer multidisciplinary clinic.  Findings are as outlined below.  Assessment  1. Prostate cancer (C61)  Discussion/Summary  1.  Prostate cancer: We discussed his MRI results which demonstrate a fairly significant lesion toward the left anterior apex of the prostate and the fact that he did have a positive biopsy core without grade Gleason 3+4 = 7 adenocarcinoma at this site.  We discussed that this does demonstrate clinical progression compared to his initial diagnosis in 2012.   The patient was counseled about the natural history of prostate cancer and the standard treatment options that are available for prostate cancer. It was explained to him how his age and life expectancy, clinical stage, Gleason score, and PSA affect his prognosis, the decision to proceed with additional staging studies, as well as how that information influences recommended treatment strategies. We discussed the roles for active surveillance, radiation therapy, surgical therapy, androgen deprivation, as well as ablative therapy options for the treatment of prostate cancer as appropriate to his individual cancer situation. We discussed the risks and benefits of these options with regard to their impact on cancer control  and also in terms of potential adverse events, complications, and impact on quiality of life particularly related to urinary, bowel, and sexual function. The patient was encouraged to ask questions throughout the discussion today and all questions were answered to his stated satisfaction. In addition, the patient was provided with and/or directed to appropriate resources and literature for further education about prostate cancer and treatment options.   We discussed surgical therapy for prostate cancer including the different available surgical approaches. We discussed, in detail, the risks and expectations of surgery with regard to cancer control, urinary control, and erectile function as well as the expected postoperative recovery process. Additional risks of surgery including but not limited to bleeding, infection, hernia formation, nerve damage, lymphocele formation, bowel/rectal injury potentially necessitating colostomy, damage to the urinary tract resulting in urine leakage, urethral stricture, and the cardiopulmonary risks such as myocardial infarction, stroke, death, venothromboembolism, etc. were explained. The risk of open surgical conversion for robotic/laparoscopic prostatectomy was also discussed.   Mr. Geffre remains in excellent health at age 50 and is interested very much in his longevity.  We discussed options that would involve either continued active surveillance albeit with increased risk and some concern based on his MRI results versus the option of proceeding with therapy of curative intent.  We have reviewed options for curative intent in detail today and specifically focused our discussion on surgical therapy and radiation therapy.  He has met with both Dr. Alen Blew and Dr. Valere Dross today as well.  After reviewing the pros and cons of each approach, he would like to further consider his options and will notify us how we can be of assistance to him.  Cc: Dr. Arloa Koh Dr. Zola Button Dr. Cari Caraway  A total of 55 minutes were spent in the overall care of the patient today with 55 minutes in direct face to face consultation.    Signatures Electronically signed by : Raynelle Bring, M.D.; Feb 27 2015  4:13PM EST

## 2015-02-27 NOTE — Progress Notes (Signed)
Reason for Referral: Prostate cancer.   HPI: This is a pleasant 69 year old gentleman native of [place] but currently retired and resides in [place]. He is a gentleman in reasonably good health and shape with history of hypertension and hyperlipidemia and was diagnosed with prostate cancer dating back in 2012. At that time his PSA was 5.9 and underwent a biopsy at [facility] and showed a Gleason score 3+3 = 6 in 2 out of 12 cores. He had a left mid apex and right mid apex cores involved. At that time he was given the option of treatment and elected to have observation and surveillance. Patient establish care recently with Dr. Alinda Money and underwent a repeat PSA which was 7.10 in August 2016. He also had an MRI done which showed a suspicious finding in the anterior left apex and a biopsy obtained on 02/21/2015. He continues to have 1 core involved with malignancy in the apical area but the volume of disease have increased with a Gleason score 3+4 = 7 in 70% of the core. Patient's is asymptomatic at this time with very minimal urine symptoms. He does have erectile dysfunction but does not report any hematuria or dysuria. He continues to be in excellent health and shape. He denied any headaches or blurry vision or syncope or seizures. He does not report any chest pain or palpitation. As that report any cough or hemoptysis. Does not report any nausea, vomiting or abdominal pain. Remaining review of systems unremarkable.   Past Medical History  Diagnosis Date  . Hypertension   . Hyperlipidemia   . Prostate cancer ([place])   . GERD (gastroesophageal reflux disease)   . Allergic rhinitis   . Glaucoma   . Prostate cancer ([place]) 02/21/15  . Glaucoma   :  Past Surgical History  Procedure Laterality Date  . Tonsillectomy    . Vasectomy    :   Current outpatient prescriptions:  .  aspirin 81 MG tablet, Take 81 mg by mouth daily., Disp: , Rfl:  .  atorvastatin (LIPITOR) 20 MG tablet,  , Disp: , Rfl:  .  Calcium Carbonate-Vitamin D (CALCIUM 600+D) 600-400 MG-UNIT per tablet, Take 1 tablet by mouth daily., Disp: , Rfl:  .  desloratadine (CLARINEX) 5 MG tablet, Take 5 mg by mouth as needed., Disp: , Rfl:  .  hydrochlorothiazide (HYDRODIURIL) 25 MG tablet, , Disp: , Rfl:  .  Multiple Vitamin (MULTIVITAMIN) tablet, Take 1 tablet by mouth daily., Disp: , Rfl:  .  omeprazole (PRILOSEC) 20 MG capsule, , Disp: , Rfl: :  No Known Allergies:  Family History  Problem Relation Age of Onset  . Atrial fibrillation Mother   . Heart failure Mother   . Cancer - Lung Father   . Prostate cancer Father   . Arrhythmia Father   . Hypertension Brother   . Hyperlipidemia Brother   :  Social History   Social History  . Marital Status: Married    Spouse Name: N/A  . Number of Children: N/A  . Years of Education: N/A   Occupational History  . Not on file.   Social History Main Topics  . Smoking status: Never Smoker   . Smokeless tobacco: Not on file  . Alcohol Use: 0.0 oz/week    0 Standard drinks or equivalent per week     Comment: [place]  . Drug Use: No  . Sexual Activity: Not on file   Other Topics Concern  . Not on file  Social History Narrative  :  Pertinent items are noted in HPI.  Exam: ECOG 0 General appearance: alert and cooperative Head: Normocephalic, without obvious abnormality Throat: lips, mucosa, and tongue normal; teeth and gums normal Neck: no adenopathy Back: negative Resp: clear to auscultation bilaterally Chest wall: no tenderness Cardio: regular rate and rhythm, S1, S2 normal, no murmur, click, rub or gallop GI: soft, non-tender; bowel sounds normal; no masses,  no organomegaly Extremities: extremities normal, atraumatic, no cyanosis or edema Pulses: 2+ and symmetric   Mr Prostate W / Wo Cm  02/09/2015  CLINICAL DATA:  Biopsy-proven prostate cancer. On active surveillance. Scheduled for repeat biopsy. EXAM: MR PROSTATE WITHOUT AND  WITH CONTRAST TECHNIQUE: Multiplanar multisequence MRI images were obtained of the pelvis centered about the prostate. Pre and post contrast images were obtained. CONTRAST:  78mL MULTIHANCE GADOBENATE DIMEGLUMINE 529 MG/ML IV SOLN COMPARISON:  The biopsy result dated 11/25/2010. FINDINGS: Prostate: Relatively mild central gland enlargement and heterogeneity, consistent with benign prostatic hyperplasia. Diffuse heterogeneous T2 signal throughout the peripheral zone, nonspecific. More focal area of masslike T2 hypo intensity within the anterior aspect of the left apex. This measures 9 x 5 mm on image/series 18/5. 10 mm craniocaudal on image/series 10/8. Vaguely apparent on 16/7. This corresponds to restricted diffusion on image/series 02/999 and 02/1099. Early post-contrast enhancement, most apparent on image/series 47/11202. Transcapsular spread: No gross trans capsular spread identified. The lesion likely contacts the capsule throughout nearly 10 mm, including on image/series 10/8. Seminal vesicle involvement: Absent Neurovascular bundle involvement: Absent Pelvic adenopathy: Absent Bone metastasis: Absent Other findings: Sigmoid diverticulosis. Normal urinary bladder. No significant free fluid. IMPRESSION: 1. Suspicious (PI-RADS 4) finding within the anterior left apex. Presence of restricted diffusion and early post-contrast enhancement suggest relatively high-grade disease. Focused sampling of this area is suggested. 2. Although no gross trans capsular spread is seen, contact of the capsule for close to 1 cm may predispose to small volume trans capsular spread. 3.  No evidence of locally advanced or pelvic metastatic disease. Electronically Signed   By: Abigail Miyamoto M.D.   On: 02/09/2015 14:22    Assessment and Plan:   69 year old gentleman with the prostate cancer initially diagnosed in July 2012 with a Gleason score 3+3 = 6 and a PSA 5.9. Most recently his PSA is up to 7.1 with a Gleason score 3+4 = 7 in  one core detected by biopsy done on 02/21/2015. His case was discussed today in the prostate cancer multidisciplinary clinic and imaging studies were reviewed with the reviewing radiologist. His options of treatment would include surgical resection versus radiation therapy as a definitive modality. Although observation and surveillance is not completely rule out a would be advisable to pursue that given his life expectancy exceeds 10 years at this time. The role of systemic therapy was also reviewed today and other than the role of short course of androgen deprivation potentially with radiation therapy there is normal for it at this time. All his questions were answered today to his satisfaction.

## 2015-02-27 NOTE — Progress Notes (Signed)
   Survivorship Program  Mr. Austin Guerrero is a very pleasant 69 y.o. gentleman from [place], [place] with a diagnosis of prostate adenocarcinoma. He presents today with his wife to the [facility]) for treatment consideration and recommendations from the urologist/surgeon, radiation oncologist, and medical oncologist.    I briefly met with Mr. Austin Guerrero and his wife during his [place] visit today. We discussed the purpose of the Survivorship Clinic, which will include monitoring for recurrence, coordinating completion of age and gender-appropriate cancer screenings, promotion of overall wellness, as well as managing potential late/long-term side effects of anti-cancer treatments.     As of today, the intent of treatment for Mr. Austin Guerrero is cure/local control, therefore he will be eligible for the Survivorship Clinic upon his completion of treatment.  His survivorship care plan (SCP) will be reviewed with him during an in-person visit with myself once he has completed treatment.    Mr. Austin Guerrero was encouraged to ask questions and all questions were answered to his satisfaction.  He was given my business card and encouraged to contact me with any concerns regarding survivorship.  I look forward to participating in his care.    Mike Craze, NP [place] [phone]

## 2015-03-05 ENCOUNTER — Encounter: Payer: Self-pay | Admitting: Radiation Oncology

## 2015-03-05 NOTE — Progress Notes (Signed)
CC: Dr. Dutch Gray, Dr. Tyler Pita   Chart note: Austin Guerrero called me today and he would like to proceed with external beam/IMRT.  I informed him that we would need to have 3 gold seed markers placed for image guidance.  He'll be out of town from November 12 through November 19.  Anytime after November 19 would be satisfactory for placement of 3 gold seed markers.  At the earliest, he would not start his radiation therapy until mid December.  With my retirement at the end of December, he would like to meet with Austin Guerrero  before CT simulation.  I will, we asked Dr. Alinda Money to schedule placement of 3 gold seed markers and also get him back for a follow-up visit with Austin Guerrero.

## 2015-03-06 ENCOUNTER — Telehealth: Payer: Self-pay | Admitting: *Deleted

## 2015-03-06 NOTE — Telephone Encounter (Signed)
CALLED PATIENT TO INFORM OF GOLD SEED PLACEMENT ON 04-03-15- ARRIVAL TIME - 1:15 PM @ DR. BORDEN'S OFFICE AND HIS [place] VISIT WITH DR.MANNING ON 04-02-15 - ARRIVAL TIME - 1:20 PM, LVM FOR A RETURN CALL

## 2015-03-13 ENCOUNTER — Telehealth: Payer: Self-pay | Admitting: Medical Oncology

## 2015-03-13 NOTE — Telephone Encounter (Signed)
Oncology Nurse Navigator Documentation  Oncology Nurse Navigator Flowsheets 02/26/2015 02/27/2015 03/13/2015  Referral date to RadOnc/MedOnc 02/23/2015 - -  Navigator Encounter Type Introductory phone call Clinic/MDC Telephone- Pt states that he will be having his gold markers place 11/29 and he will see Dr. Tammi Klippel on the 28 th. He asked if he will be able to drive himself. I told him there is not restrictions and he can drive. All questions answered. I asked him to call me with any questions or concerns. He voiced understanding.  Patient Visit Type - - Follow-up  Barriers/Navigation Needs No barriers at this time No barriers at this time No barriers at this time  Support Groups/Services - Prostate;Friends and Family Friends and Family  Time Spent with Patient [phone]

## 2015-04-02 ENCOUNTER — Encounter: Payer: Self-pay | Admitting: Radiation Oncology

## 2015-04-02 ENCOUNTER — Ambulatory Visit
Admission: RE | Admit: 2015-04-02 | Discharge: 2015-04-02 | Disposition: A | Discharge status: Home / Self Care | Payer: Medicare Other | Source: Ambulatory Visit | Attending: Radiation Oncology | Admitting: Radiation Oncology

## 2015-04-02 ENCOUNTER — Encounter: Payer: Self-pay | Admitting: Medical Oncology

## 2015-04-02 VITALS — BP 153/89 | HR 86 | Resp 16 | Wt 158.2 lb

## 2015-04-02 DIAGNOSIS — F1010 Alcohol abuse, uncomplicated: Secondary | ICD-10-CM | POA: Insufficient documentation

## 2015-04-02 DIAGNOSIS — C61 Malignant neoplasm of prostate: Secondary | ICD-10-CM

## 2015-04-02 DIAGNOSIS — E785 Hyperlipidemia, unspecified: Secondary | ICD-10-CM | POA: Insufficient documentation

## 2015-04-02 DIAGNOSIS — Z8042 Family history of malignant neoplasm of prostate: Secondary | ICD-10-CM | POA: Diagnosis not present

## 2015-04-02 DIAGNOSIS — K219 Gastro-esophageal reflux disease without esophagitis: Secondary | ICD-10-CM | POA: Diagnosis not present

## 2015-04-02 DIAGNOSIS — Z801 Family history of malignant neoplasm of trachea, bronchus and lung: Secondary | ICD-10-CM | POA: Diagnosis not present

## 2015-04-02 DIAGNOSIS — I10 Essential (primary) hypertension: Secondary | ICD-10-CM | POA: Insufficient documentation

## 2015-04-02 DIAGNOSIS — Z8249 Family history of ischemic heart disease and other diseases of the circulatory system: Secondary | ICD-10-CM | POA: Insufficient documentation

## 2015-04-02 DIAGNOSIS — J309 Allergic rhinitis, unspecified: Secondary | ICD-10-CM | POA: Diagnosis not present

## 2015-04-02 DIAGNOSIS — Z510 Encounter for antineoplastic radiation therapy: Secondary | ICD-10-CM | POA: Diagnosis present

## 2015-04-02 DIAGNOSIS — H409 Unspecified glaucoma: Secondary | ICD-10-CM | POA: Insufficient documentation

## 2015-04-02 NOTE — Progress Notes (Signed)
Radiation Oncology         [phone] ________________________________  Initial Outpatient Consultation  Name: Austin Guerrero MRN: [id number]  Date: 04/02/2015  DOB: [birth year]  CC:No primary care provider on file.  Raynelle Bring, MD   REFERRING PHYSICIAN: Raynelle Bring, MD  DIAGNOSIS: 69 y.o. gentleman with stage T1c adenocarcinoma of the prostate with a Gleason's score of 3+4 and a PSA of 7.1    ICD-9-CM ICD-10-CM   1. Prostate cancer ([place]) [place] ILLNESS::Fitzgerald Haselhuhn is a 69 y.o. gentleman who was initially diagnosed with prostate cancer in July 2012 after being found to have an elevated PSA of 5.9 on 10/11/2010.  He was seen at [facility] and underwent ultrasound-guided biopsies on 11/25/2010.  This showed Gleason 3+3 in 2 of 12 biopsy cores; including 20% of one core at the left apex and 33% of another core at the right apex.  He elected for active surveillance and he was simply followed by PSA blood test by his primary care physician at the time.  His PSA was 3.6 in December 2012, 5.4 in September 2013, 7.05 in January 2016, 7.08 in April 2016, and most recently 7.1 on 12/19/2014.   He moved to [facility] and has been followed by Dr. Theadore Nan.  She wanted consultation with urology, therefore he was seen by Dr. Alinda Money.  Dr. Alinda Money obtained a prostate MR scan on 02/09/2015 which showed a suspicious mass within the left anterior apical region measuring approximately 0.9 x 0.5 cm.  There was no gross trans-capsular spread seen.  The suspicious lesion likely contacted the capsule throughout nearly 1.0 cm.  Repeat biopsies on 02/21/2015 showed Gleason 3+4 involving 70% of one core from the left apex (pattern 4 equal 20%).  He was also found to have Gleason 3+3 involving 80% of a second core from the left apex.  Remaining biopsies were benign.  His prostate volume was 29.4 mL.  He is doing well from a GU and GI standpoint.  His IPSS score was 7.  He does have erectile  dysfunction and this is not a major concern for him.  The patient reviewed the biopsy results with his urologist. The patient was previously seen by Dr. Valere Dross on 02/27/15 and elected to proceed with external radiation. He returns today to coordinate treatment.  The patient presents to the clinic with his wife.  PREVIOUS RADIATION THERAPY: No  PAST MEDICAL HISTORY:  has a past medical history of Hypertension; Hyperlipidemia; Prostate cancer ([place]); GERD (gastroesophageal reflux disease); Allergic rhinitis; Glaucoma; Prostate cancer ([place]) (02/21/15); and Glaucoma.    PAST SURGICAL HISTORY: Past Surgical History  Procedure Laterality Date  . Tonsillectomy    . Vasectomy      FAMILY HISTORY: family history includes Arrhythmia in his father; Atrial fibrillation in his mother; Cancer - Lung in his father; Heart failure in his mother; Hyperlipidemia in his brother; Hypertension in his brother; Prostate cancer in his father.  SOCIAL HISTORY:  reports that he has never smoked. He has never used smokeless tobacco. He reports that he drinks alcohol. He reports that he does not use illicit drugs.  ALLERGIES: Review of patient's allergies indicates no known allergies.  MEDICATIONS:  Current Outpatient Prescriptions  Medication Sig Dispense Refill  . aspirin 81 MG tablet Take 81 mg by mouth daily.    Marland Kitchen atorvastatin (LIPITOR) 20 MG tablet Take by mouth.    . Calcium Carbonate-Vitamin D (CALCIUM 600+D) 600-400 MG-UNIT per tablet Take 1  tablet by mouth daily.    . hydrochlorothiazide (HYDRODIURIL) 25 MG tablet     . Multiple Vitamin (MULTIVITAMIN) tablet Take 1 tablet by mouth daily.    Marland Kitchen omeprazole (PRILOSEC) 10 MG capsule Take by mouth.    . vitamin C (ASCORBIC ACID) 500 MG tablet Take 500 mg by mouth daily.    Marland Kitchen desloratadine (CLARINEX) 5 MG tablet Take 5 mg by mouth as needed.     No current facility-administered medications for this encounter.    REVIEW OF SYSTEMS:  A 15 point review of  systems is documented in the electronic medical record. This was obtained by the nursing staff. However, I reviewed this with the patient to discuss relevant findings and make appropriate changes.  Pertinent items are noted in HPI..  The patient completed an IPSS and IIEF questionnaire.  His IPSS score was 6 indicating mild urinary outflow obstructive symptoms.  He indicated that his erectile function is unable to complete sexual activity.   PHYSICAL EXAM: This patient is in no acute distress.  He is alert and oriented.   weight is 158 lb 3.2 oz (71.759 kg). His blood pressure is 153/89 and his pulse is 86. His respiration is 16 and oxygen saturation is 100%.  He exhibits no respiratory distress or labored breathing.  He appears neurologically intact.  His mood is pleasant.  His affect is appropriate.  Please note the digital rectal exam findings described above.  KPS = 100  100 - Normal; no complaints; no evidence of disease. 90   - Able to carry on normal activity; minor signs or symptoms of disease. 80   - Normal activity with effort; some signs or symptoms of disease. 81   - Cares for self; unable to carry on normal activity or to do active work. 60   - Requires occasional assistance, but is able to care for most of his personal needs. 50   - Requires considerable assistance and frequent medical care. 9   - Disabled; requires special care and assistance. 20   - Severely disabled; hospital admission is indicated although death not imminent. 53   - Very sick; hospital admission necessary; active supportive treatment necessary. 10   - Moribund; fatal processes progressing rapidly. 0     - Dead  Karnofsky DA, Abelmann WH, Craver LS and Burchenal JH [phone]) The use of the nitrogen mustards in the palliative treatment of carcinoma: with particular reference to bronchogenic carcinoma Cancer 1 634-56   LABORATORY DATA:  No results found for: WBC, HGB, HCT, MCV, PLT No results found for: NA, K, CL,  CO2 No results found for: ALT, AST, GGT, ALKPHOS, BILITOT   RADIOGRAPHY: No results found.    IMPRESSION: This gentleman is a 68 y.o. gentleman with stage T1c adenocarcinoma of the prostate with a Gleason's score of 3+4 and a PSA of 7.1.  His T-Stage, Gleason's Score, and PSA put him into the intermediate risk group.  Accordingly he is eligible for a variety of potential treatment options including prostatectomy, external radiation, or prostate seed implant.  PLAN: Today I reviewed the findings and workup thus far.  We discussed the natural history of prostate cancer.  We reviewed the the implications of T-stage, Gleason's Score, and PSA on decision-making and outcomes in prostate cancer.  We discussed radiation treatment in the management of prostate cancer with regard to the logistics and delivery of external beam radiation treatment as well as the logistics and delivery of prostate brachytherapy.  We compared  and contrasted each of these approaches and also compared these against prostatectomy.  The patient expressed interest in external beam radiotherapy.  The patient would like to proceed with prostate IMRT.  I will share my findings with Dr. Alinda Money.  He is already scheduled to move forward with placement of three gold fiducial markers into the prostate tomorrow to proceed with IMRT thereafter.     I enjoyed meeting with him today, and will look forward to participating in the care of this very nice gentleman.  CT simulation has been scheduled on 05/11/15 at [facility].  I spent 30 minutes face to face with the patient and more than 50% of that time was spent in counseling and/or coordination of care.   ------------------------------------------------  Sheral Apley. Tammi Klippel, M.D.  This document serves as a record of services personally performed by Tyler Pita, MD. It was created on his behalf by Darcus Austin, a trained medical scribe. The creation of this record is based on the scribe's personal  observations and the provider's statements to them. This document has been checked and approved by the attending provider.

## 2015-04-02 NOTE — Progress Notes (Signed)
GU Location of Tumor / Histology: Adenocarcinoma of the Prostate    If Prostate Cancer, Gleason Score is (3 + 3) and PSA (7.10)  [name] presented with a rising PSA  Past/Anticipated interventions by urology, if any: prostate biopsy, gold markers to be placed tomorrow by Alinda Money  Past/Anticipated interventions by medical oncology, if any: Unknown  Weight changes, if any: No  Bowel/Bladder complaints, if any: weak stream which he relates to age, denies difficulty emptying his bladder, reports nocturia x 1 if at all, denies dysuria or hematuria, denies incontinence.    Nausea/Vomiting, if any: No  Pain issues, if any: No  SAFETY ISSUES:  Prior radiation? No  Pacemaker/ICD? No  Possible current pregnancy? No  Is the patient on methotrexate? No  Current Complaints / other details: IPSS 6

## 2015-04-02 NOTE — Progress Notes (Signed)
See progress note under physician encounter. 

## 2015-04-03 DIAGNOSIS — C61 Malignant neoplasm of prostate: Secondary | ICD-10-CM | POA: Diagnosis not present

## 2015-04-05 DIAGNOSIS — D1801 Hemangioma of skin and subcutaneous tissue: Secondary | ICD-10-CM | POA: Diagnosis not present

## 2015-04-05 DIAGNOSIS — L570 Actinic keratosis: Secondary | ICD-10-CM | POA: Diagnosis not present

## 2015-04-05 DIAGNOSIS — D171 Benign lipomatous neoplasm of skin and subcutaneous tissue of trunk: Secondary | ICD-10-CM | POA: Diagnosis not present

## 2015-04-05 DIAGNOSIS — L821 Other seborrheic keratosis: Secondary | ICD-10-CM | POA: Diagnosis not present

## 2015-04-16 DIAGNOSIS — C61 Malignant neoplasm of prostate: Secondary | ICD-10-CM | POA: Diagnosis not present

## 2015-04-16 DIAGNOSIS — K4190 Unilateral femoral hernia, without obstruction or gangrene, not specified as recurrent: Secondary | ICD-10-CM | POA: Diagnosis not present

## 2015-05-08 ENCOUNTER — Ambulatory Visit: Payer: Self-pay | Admitting: General Surgery

## 2015-05-08 DIAGNOSIS — C61 Malignant neoplasm of prostate: Secondary | ICD-10-CM | POA: Diagnosis not present

## 2015-05-08 DIAGNOSIS — K4090 Unilateral inguinal hernia, without obstruction or gangrene, not specified as recurrent: Secondary | ICD-10-CM | POA: Diagnosis not present

## 2015-05-11 ENCOUNTER — Ambulatory Visit
Admission: RE | Admit: 2015-05-11 | Discharge: 2015-05-11 | Disposition: A | Discharge status: Home / Self Care | Payer: Medicare Other | Source: Ambulatory Visit | Attending: Radiation Oncology | Admitting: Radiation Oncology

## 2015-05-11 DIAGNOSIS — Z510 Encounter for antineoplastic radiation therapy: Secondary | ICD-10-CM | POA: Diagnosis present

## 2015-05-11 DIAGNOSIS — F1010 Alcohol abuse, uncomplicated: Secondary | ICD-10-CM | POA: Diagnosis not present

## 2015-05-11 DIAGNOSIS — C61 Malignant neoplasm of prostate: Secondary | ICD-10-CM

## 2015-05-11 DIAGNOSIS — J309 Allergic rhinitis, unspecified: Secondary | ICD-10-CM | POA: Diagnosis not present

## 2015-05-11 DIAGNOSIS — H409 Unspecified glaucoma: Secondary | ICD-10-CM | POA: Diagnosis not present

## 2015-05-11 DIAGNOSIS — K219 Gastro-esophageal reflux disease without esophagitis: Secondary | ICD-10-CM | POA: Diagnosis not present

## 2015-05-11 DIAGNOSIS — Z8042 Family history of malignant neoplasm of prostate: Secondary | ICD-10-CM | POA: Diagnosis not present

## 2015-05-11 DIAGNOSIS — Z801 Family history of malignant neoplasm of trachea, bronchus and lung: Secondary | ICD-10-CM | POA: Diagnosis not present

## 2015-05-11 DIAGNOSIS — Z8249 Family history of ischemic heart disease and other diseases of the circulatory system: Secondary | ICD-10-CM | POA: Diagnosis not present

## 2015-05-11 DIAGNOSIS — E785 Hyperlipidemia, unspecified: Secondary | ICD-10-CM | POA: Diagnosis not present

## 2015-05-11 DIAGNOSIS — I10 Essential (primary) hypertension: Secondary | ICD-10-CM | POA: Diagnosis not present

## 2015-05-11 NOTE — Progress Notes (Signed)
  Radiation Oncology         [phone] ________________________________  Name: Austin Guerrero MRN: [id number]  Date: 05/11/2015  DOB: 09-21-1945  SIMULATION AND TREATMENT PLANNING NOTE    ICD-9-CM ICD-10-CM   1. Prostate cancer ([place]) 185 C61     DIAGNOSIS:  70 y.o. gentleman with stage T1c adenocarcinoma of the prostate with a Gleason's score of 3+4 and a PSA of 7.1  NARRATIVE:  The patient was brought to the [facility].  Identity was confirmed.  All relevant records and images related to the planned course of therapy were reviewed.  The patient freely provided informed written consent to proceed with treatment after reviewing the details related to the planned course of therapy. The consent form was witnessed and verified by the simulation staff.  Then, the patient was set-up in a stable reproducible supine position for radiation therapy.  A vacuum lock pillow device was custom fabricated to position his legs in a reproducible immobilized position.  Then, I performed a urethrogram under sterile conditions to identify the prostatic apex.  CT images were obtained.  Surface markings were placed.  The CT images were loaded into the planning software.  Then the prostate target and avoidance structures including the rectum, bladder, bowel and hips were contoured.  Treatment planning then occurred.  The radiation prescription was entered and confirmed.  A total of 1 complex treatment devices were fabricated. I have requested : Intensity Modulated Radiotherapy (IMRT) is medically necessary for this case for the following reason:  Rectal sparing.Marland Kitchen  PLAN:  The patient will receive 78 Gy in 40 fractions.  ________________________________  Sheral Apley Tammi Klippel, M.D.  This document serves as a record of services personally performed by Tyler Pita, MD. It was created on his behalf by Arlyce Harman, a trained medical scribe. The creation of this record is based on the scribe's  personal observations and the provider's statements to them. This document has been checked and approved by the attending provider.

## 2015-05-14 ENCOUNTER — Telehealth: Payer: Self-pay | Admitting: Medical Oncology

## 2015-05-14 DIAGNOSIS — C61 Malignant neoplasm of prostate: Secondary | ICD-10-CM | POA: Diagnosis not present

## 2015-05-14 DIAGNOSIS — I10 Essential (primary) hypertension: Secondary | ICD-10-CM | POA: Diagnosis not present

## 2015-05-14 NOTE — Telephone Encounter (Signed)
Oncology Nurse Navigator Documentation  Oncology Nurse Navigator Flowsheets 03/13/2015 04/02/2015 05/14/2015  Navigator Location - - [facility]  Navigator Encounter Type Telephone - Telephone- Austin Guerrero has his CT simulation 05/11/15. He states things went well and the video was very helpful. He will start his radiation treatments 05/22/15. I will continue to follow him and I asked him to call me with any questions or concerns. He voiced understanding.  Patient Visit Type Follow-up Radonc;Follow-up -  Treatment Phase - - CT SIM  Barriers/Navigation Needs No barriers at this time No barriers at this time No barriers at this time  Support Groups/Services Friends and Family Friends and Family Friends and Family  Time Spent with Patient 15 15 15

## 2015-05-16 DIAGNOSIS — I10 Essential (primary) hypertension: Secondary | ICD-10-CM | POA: Diagnosis not present

## 2015-05-22 ENCOUNTER — Ambulatory Visit: Payer: Medicare Other | Admitting: Radiation Oncology

## 2015-05-23 ENCOUNTER — Ambulatory Visit: Payer: Medicare Other

## 2015-05-23 ENCOUNTER — Encounter: Payer: Self-pay | Admitting: Medical Oncology

## 2015-05-23 ENCOUNTER — Ambulatory Visit
Admission: RE | Admit: 2015-05-23 | Discharge: 2015-05-23 | Disposition: A | Discharge status: Home / Self Care | Payer: Medicare Other | Source: Ambulatory Visit | Attending: Radiation Oncology | Admitting: Radiation Oncology

## 2015-05-23 DIAGNOSIS — I10 Essential (primary) hypertension: Secondary | ICD-10-CM | POA: Diagnosis not present

## 2015-05-23 DIAGNOSIS — C61 Malignant neoplasm of prostate: Secondary | ICD-10-CM | POA: Diagnosis not present

## 2015-05-23 NOTE — Progress Notes (Signed)
Oncology Nurse Navigator Documentation  Oncology Nurse Navigator Flowsheets 04/02/2015 05/14/2015 05/23/2015  Navigator Location - [facility] -  Navigator Encounter Type - Telephone Treatment  Patient Visit Type Radonc;Follow-up - -  Treatment Phase - CT SIM First Radiation Tx- Mr. Etling completed his first radiation treatment today. He was scheduled to start yesterday but the machine was down. He states that he the treatment was pretty easy. Being the first day he he was a little anxious. He asked what side effects most patients experience. We discussed these and informed him he will see Dr. Tammi Klippel once a week to evaluate these. I asked him to call me with any questions or concerns. He voiced understanding.  Barriers/Navigation Needs No barriers at this time No barriers at this time No barriers at this time  Interventions - - None required  Support Groups/Services Friends and Family Friends and Family Friends and Family  Acuity - - Level 1  Acuity Level 1 - - Initial guidance, education and coordination as needed  Time Spent with Patient 15 15 15

## 2015-05-24 ENCOUNTER — Encounter: Payer: Self-pay | Admitting: Radiation Oncology

## 2015-05-24 ENCOUNTER — Ambulatory Visit
Admission: RE | Admit: 2015-05-24 | Discharge: 2015-05-24 | Disposition: A | Discharge status: Home / Self Care | Payer: Medicare Other | Source: Ambulatory Visit | Attending: Radiation Oncology | Admitting: Radiation Oncology

## 2015-05-24 VITALS — BP 149/83 | HR 66 | Resp 16 | Wt 157.3 lb

## 2015-05-24 DIAGNOSIS — I10 Essential (primary) hypertension: Secondary | ICD-10-CM | POA: Diagnosis not present

## 2015-05-24 DIAGNOSIS — C61 Malignant neoplasm of prostate: Secondary | ICD-10-CM | POA: Diagnosis not present

## 2015-05-24 NOTE — Progress Notes (Addendum)
Weight and vitals stable. Denies pain. Reports nocturia x 1 if at all. Denies dysuria or hematuria. Denies incontinence, leakage, or urgency. Denies difficulty emptying his bladder. Describes a weak urine stream. Denies diarrhea. Denies fatigue. Oriented patient to staff and routine of the clinic. Provided patient with RADIATION THERAPY AND YOU handbook then, reviewed pertinent information. Educated patient reference potential side effects and management such as fatigue, diarrhea, and urinary bladder changes. Answered all patient questions to the best of my ability. Provided patient with my business card and encouraged him to call with needs. Patient verbalized understanding of all reviewed.   BP 149/83 mmHg  Pulse 66  Resp 16  Wt 157 lb 4.8 oz (71.351 kg)  SpO2 100% Wt Readings from Last 3 Encounters:  05/24/15 157 lb 4.8 oz (71.351 kg)  04/02/15 158 lb 3.2 oz (71.759 kg)  02/27/15 161 lb (73.029 kg)

## 2015-05-24 NOTE — Progress Notes (Addendum)
  Radiation Oncology         [phone]   Name: Austin Guerrero MRN: [id number]   Date: 05/24/2015  DOB: 07-30-1945   Weekly Radiation Therapy Management    ICD-9-CM ICD-10-CM   1. Prostate cancer ([place]) 185 C61     Current Dose: 3.9 Gy  Planned Dose:  78 Gy  Narrative The patient presents for routine under treatment assessment.  Weight and vitals stable. Denies pain. Reports nocturia x 1 if at all. Denies dysuria or hematuria. Denies incontinence, leakage, or urgency. Denies difficulty emptying his bladder. Describes a weak urine stream. Denies diarrhea. Denies fatigue.    The patient is without complaint. Set-up films were reviewed. The chart was checked.  Physical Findings  weight is 157 lb 4.8 oz (71.351 kg). His blood pressure is 149/83 and his pulse is 66. His respiration is 16 and oxygen saturation is 100%. . Weight essentially stable.  No significant changes.  Impression The patient is tolerating radiation.  Plan Continue treatment as planned.         Sheral Apley Tammi Klippel, M.D.   This document serves as a record of services personally performed by Tyler Pita, MD. It was created on his behalf by Jenell Milliner, a trained medical scribe. The creation of this record is based on the scribe's personal observations and the provider's statements to them. This document has been checked and approved by the attending provider.

## 2015-05-25 ENCOUNTER — Ambulatory Visit
Admission: RE | Admit: 2015-05-25 | Discharge: 2015-05-25 | Disposition: A | Discharge status: Home / Self Care | Payer: Medicare Other | Source: Ambulatory Visit | Attending: Radiation Oncology | Admitting: Radiation Oncology

## 2015-05-25 DIAGNOSIS — I10 Essential (primary) hypertension: Secondary | ICD-10-CM | POA: Diagnosis not present

## 2015-05-25 DIAGNOSIS — C61 Malignant neoplasm of prostate: Secondary | ICD-10-CM | POA: Diagnosis not present

## 2015-05-28 ENCOUNTER — Ambulatory Visit
Admission: RE | Admit: 2015-05-28 | Discharge: 2015-05-28 | Disposition: A | Discharge status: Home / Self Care | Payer: Medicare Other | Source: Ambulatory Visit | Attending: Radiation Oncology | Admitting: Radiation Oncology

## 2015-05-28 DIAGNOSIS — H409 Unspecified glaucoma: Secondary | ICD-10-CM | POA: Diagnosis not present

## 2015-05-28 DIAGNOSIS — Z0000 Encounter for general adult medical examination without abnormal findings: Secondary | ICD-10-CM | POA: Diagnosis not present

## 2015-05-28 DIAGNOSIS — I10 Essential (primary) hypertension: Secondary | ICD-10-CM | POA: Diagnosis not present

## 2015-05-28 DIAGNOSIS — E785 Hyperlipidemia, unspecified: Secondary | ICD-10-CM | POA: Diagnosis not present

## 2015-05-28 DIAGNOSIS — C61 Malignant neoplasm of prostate: Secondary | ICD-10-CM | POA: Diagnosis not present

## 2015-05-28 DIAGNOSIS — J309 Allergic rhinitis, unspecified: Secondary | ICD-10-CM | POA: Diagnosis not present

## 2015-05-28 DIAGNOSIS — R972 Elevated prostate specific antigen [PSA]: Secondary | ICD-10-CM | POA: Diagnosis not present

## 2015-05-29 ENCOUNTER — Ambulatory Visit
Admission: RE | Admit: 2015-05-29 | Discharge: 2015-05-29 | Disposition: A | Discharge status: Home / Self Care | Payer: Medicare Other | Source: Ambulatory Visit | Attending: Radiation Oncology | Admitting: Radiation Oncology

## 2015-05-29 DIAGNOSIS — C61 Malignant neoplasm of prostate: Secondary | ICD-10-CM | POA: Diagnosis not present

## 2015-05-29 DIAGNOSIS — I10 Essential (primary) hypertension: Secondary | ICD-10-CM | POA: Diagnosis not present

## 2015-05-30 ENCOUNTER — Ambulatory Visit
Admission: RE | Admit: 2015-05-30 | Discharge: 2015-05-30 | Disposition: A | Discharge status: Home / Self Care | Payer: Medicare Other | Source: Ambulatory Visit | Attending: Radiation Oncology | Admitting: Radiation Oncology

## 2015-05-30 DIAGNOSIS — I10 Essential (primary) hypertension: Secondary | ICD-10-CM | POA: Diagnosis not present

## 2015-05-30 DIAGNOSIS — C61 Malignant neoplasm of prostate: Secondary | ICD-10-CM | POA: Diagnosis not present

## 2015-05-31 ENCOUNTER — Ambulatory Visit
Admission: RE | Admit: 2015-05-31 | Discharge: 2015-05-31 | Disposition: A | Discharge status: Home / Self Care | Payer: Medicare Other | Source: Ambulatory Visit | Attending: Radiation Oncology | Admitting: Radiation Oncology

## 2015-05-31 DIAGNOSIS — C61 Malignant neoplasm of prostate: Secondary | ICD-10-CM | POA: Diagnosis not present

## 2015-05-31 DIAGNOSIS — I10 Essential (primary) hypertension: Secondary | ICD-10-CM | POA: Diagnosis not present

## 2015-06-01 ENCOUNTER — Ambulatory Visit
Admission: RE | Admit: 2015-06-01 | Discharge: 2015-06-01 | Disposition: A | Discharge status: Home / Self Care | Payer: Medicare Other | Source: Ambulatory Visit | Attending: Radiation Oncology | Admitting: Radiation Oncology

## 2015-06-01 ENCOUNTER — Encounter: Payer: Self-pay | Admitting: Radiation Oncology

## 2015-06-01 VITALS — BP 144/83 | HR 73 | Temp 98.1°F | Ht 66.0 in | Wt 156.7 lb

## 2015-06-01 DIAGNOSIS — I10 Essential (primary) hypertension: Secondary | ICD-10-CM | POA: Diagnosis not present

## 2015-06-01 DIAGNOSIS — Z1389 Encounter for screening for other disorder: Secondary | ICD-10-CM | POA: Diagnosis not present

## 2015-06-01 DIAGNOSIS — C61 Malignant neoplasm of prostate: Secondary | ICD-10-CM

## 2015-06-01 DIAGNOSIS — K219 Gastro-esophageal reflux disease without esophagitis: Secondary | ICD-10-CM | POA: Diagnosis not present

## 2015-06-01 DIAGNOSIS — Z79899 Other long term (current) drug therapy: Secondary | ICD-10-CM | POA: Diagnosis not present

## 2015-06-01 DIAGNOSIS — Z0000 Encounter for general adult medical examination without abnormal findings: Secondary | ICD-10-CM | POA: Diagnosis not present

## 2015-06-01 DIAGNOSIS — E785 Hyperlipidemia, unspecified: Secondary | ICD-10-CM | POA: Diagnosis not present

## 2015-06-01 NOTE — Progress Notes (Signed)
Austin Guerrero has completed 8 fractions to his prostate.  He reports having abdominal pain on and off this morning.  He said he is ok with sitting down but the cramping starts with movement.  He denies having nausea and diarrhea.  He denies having nocturia, frequency, hematuria and fatigue.  He reports having dysuria one time 2 days ago.  BP 163/93 mmHg  Pulse 74  Temp(Src) 98.1 F (36.7 C) (Oral)  Ht 5\' 6"  (1.676 m)  Wt 156 lb 11.2 oz (71.079 kg)  BMI 25.30 kg/m2

## 2015-06-01 NOTE — Patient Instructions (Signed)
Contact our office if you have any questions following today's appointment: [phone].  

## 2015-06-01 NOTE — Progress Notes (Signed)
  Radiation Oncology         [phone]   Name: Austin Guerrero MRN: [id number]   Date: 06/01/2015  DOB: 1945-07-31   Weekly Radiation Therapy Management    ICD-9-CM ICD-10-CM   1. Prostate cancer ([place]) 185 C61     Current Dose: 15.6 Gy  Planned Dose:  78 Gy  Narrative The patient presents for routine under treatment assessment.  Austin Guerrero has completed 8 fractions to his prostate. He reports having abdominal pain on and off this morning in the upper abdomen. He denies any nausea or vomiting, he is not fevers chills, chest pain or shortness of breath. He denies any particular activity which brought this on, but states that movement seems to make it worse. He said he is ok with sitting down but the cramping starts with movement. He reports experiencing pain when lying down or walking. He denies diarrhea. Denies indigestion, reflux, and burning sensation in abdominal area. He denies having nocturia, frequency, hematuria and fatigue. He reports having dysuria one time 2 days ago. No other complaints or verbalized.   Set-up films were reviewed. The chart was checked.  Physical Findings  height is 5\' 6"  (1.676 m) and weight is 156 lb 11.2 oz (71.079 kg). His oral temperature is 98.1 F (36.7 C). His blood pressure is 144/83 and his pulse is 73.   In general, this is a well-appearing caucasion male, he's alert and oriented 4 in appropriate throughout the examination. Chest is clear to auscultation bilaterally. Cardiovascular exam reveals a regular rate and rhythm no clicks rubs or murmurs auscultated. Abdomenhas audible bowel sounds in all quadrants, is soft, nontender nondistended without palpable hepatosplenomegaly. No fascial defects are appreciated with Valsalva. The abdomen and tympanic to percussion.   Impression  Austin Guerrero is a pleasant 70 y.o. gentleman with stage T1c adenocarcinoma of the prostate with a Gleason's score of 3+4 and a PSA of 7.1. The patient is tolerating radiation.    Plan Continue treatment as planned. Suggested Gas-x for abdominal pain. Also suggested Imodium AD.    The above documentation reflects my direct findings during this shared patient visit. Please see the separate note by Dr. Tammi Klippel on this date for the remainder of the patient's plan of care.  Austin Guerrero, PAC    This document serves as a record of services perSsonally performed by Austin Guerrero, PAC and Austin Pita, MD. It was created on their behalf by Austin Guerrero, a trained medical scribe. The creation of this record is based on the scribe's personal observations and the provider's statements to them. This document has been checked and approved by the attending provider.

## 2015-06-04 ENCOUNTER — Encounter: Payer: Self-pay | Admitting: Medical Oncology

## 2015-06-04 ENCOUNTER — Ambulatory Visit
Admission: RE | Admit: 2015-06-04 | Discharge: 2015-06-04 | Disposition: A | Discharge status: Home / Self Care | Payer: Medicare Other | Source: Ambulatory Visit | Attending: Radiation Oncology | Admitting: Radiation Oncology

## 2015-06-04 DIAGNOSIS — I10 Essential (primary) hypertension: Secondary | ICD-10-CM | POA: Diagnosis not present

## 2015-06-04 DIAGNOSIS — C61 Malignant neoplasm of prostate: Secondary | ICD-10-CM | POA: Diagnosis not present

## 2015-06-05 ENCOUNTER — Ambulatory Visit
Admission: RE | Admit: 2015-06-05 | Discharge: 2015-06-05 | Disposition: A | Discharge status: Home / Self Care | Payer: Medicare Other | Source: Ambulatory Visit | Attending: Radiation Oncology | Admitting: Radiation Oncology

## 2015-06-05 DIAGNOSIS — C61 Malignant neoplasm of prostate: Secondary | ICD-10-CM | POA: Diagnosis not present

## 2015-06-05 DIAGNOSIS — I10 Essential (primary) hypertension: Secondary | ICD-10-CM | POA: Diagnosis not present

## 2015-06-06 ENCOUNTER — Ambulatory Visit
Admission: RE | Admit: 2015-06-06 | Discharge: 2015-06-06 | Disposition: A | Discharge status: Home / Self Care | Payer: Medicare Other | Source: Ambulatory Visit | Attending: Radiation Oncology | Admitting: Radiation Oncology

## 2015-06-06 DIAGNOSIS — C61 Malignant neoplasm of prostate: Secondary | ICD-10-CM | POA: Diagnosis not present

## 2015-06-06 DIAGNOSIS — I10 Essential (primary) hypertension: Secondary | ICD-10-CM | POA: Diagnosis not present

## 2015-06-07 ENCOUNTER — Ambulatory Visit
Admission: RE | Admit: 2015-06-07 | Discharge: 2015-06-07 | Disposition: A | Discharge status: Home / Self Care | Payer: Medicare Other | Source: Ambulatory Visit | Attending: Radiation Oncology | Admitting: Radiation Oncology

## 2015-06-07 DIAGNOSIS — I10 Essential (primary) hypertension: Secondary | ICD-10-CM | POA: Diagnosis not present

## 2015-06-07 DIAGNOSIS — C61 Malignant neoplasm of prostate: Secondary | ICD-10-CM | POA: Diagnosis not present

## 2015-06-07 NOTE — Addendum Note (Signed)
Encounter addended by: Heywood Footman, RN on: 06/07/2015 10:04 AM<BR>     Documentation filed: Notes Section

## 2015-06-08 ENCOUNTER — Ambulatory Visit
Admission: RE | Admit: 2015-06-08 | Discharge: 2015-06-08 | Disposition: A | Discharge status: Home / Self Care | Payer: Medicare Other | Source: Ambulatory Visit | Attending: Radiation Oncology | Admitting: Radiation Oncology

## 2015-06-08 VITALS — BP 146/82 | HR 73 | Resp 16 | Wt 158.1 lb

## 2015-06-08 DIAGNOSIS — C61 Malignant neoplasm of prostate: Secondary | ICD-10-CM | POA: Diagnosis not present

## 2015-06-08 DIAGNOSIS — I10 Essential (primary) hypertension: Secondary | ICD-10-CM | POA: Diagnosis not present

## 2015-06-08 NOTE — Progress Notes (Signed)
Weight and vitals stable. Denies pain. Reports nocturia x 0-2. Reports urinary urgency and frequency. Reports he has increased his fluid intake. Reports a slight hesistancy with urination. Denies dysuria however, reports it feels different when he voids. Reports abdominal cramping didn't resolve until Sunday last week even after taking a laxative and gas x on Friday. Denies any bowel issues since.   BP 146/82 mmHg  Pulse 73  Resp 16  Wt 158 lb 1.6 oz (71.714 kg)  SpO2 100% Wt Readings from Last 3 Encounters:  06/08/15 158 lb 1.6 oz (71.714 kg)  06/01/15 156 lb 11.2 oz (71.079 kg)  05/24/15 157 lb 4.8 oz (71.351 kg)

## 2015-06-08 NOTE — Progress Notes (Signed)
  Radiation Oncology         [phone]   Name: Austin Guerrero MRN: [id number]   Date: 06/08/2015  DOB: 12/05/1945     Weekly Radiation Therapy Management    ICD-9-CM ICD-10-CM   1. Prostate cancer ([place]) 185 C61     Current Dose: 25.35 Gy  Planned Dose:  78 Gy  Narrative The patient presents for routine under treatment assessment.  Weight and vitals stable. Denies pain. Reports nocturia x 0-2. Reports urinary urgency and frequency. Reports he has increased his fluid intake. Reports a slight hesistancy with urination. Denies dysuria however, reports it feels different when he voids. Reports abdominal cramping didn't resolve until Sunday last week even after taking a laxative and gas x on Friday. Denies any bowel issues since.   The patient is without complaint. Set-up films were reviewed. The chart was checked.  Physical Findings  weight is 158 lb 1.6 oz (71.714 kg). His blood pressure is 146/82 and his pulse is 73. His respiration is 16 and oxygen saturation is 100%. . Weight essentially stable.  No significant changes.  Impression The patient is tolerating radiation.  Plan Continue treatment as planned.         Sheral Apley Tammi Klippel, M.D.   This document serves as a record of services personally performed by Tyler Pita, MD. It was created on his behalf by Arlyce Harman, a trained medical scribe. The creation of this record is based on the scribe's personal observations and the provider's statements to them. This document has been checked and approved by the attending provider.

## 2015-06-11 ENCOUNTER — Ambulatory Visit
Admission: RE | Admit: 2015-06-11 | Discharge: 2015-06-11 | Disposition: A | Discharge status: Home / Self Care | Payer: Medicare Other | Source: Ambulatory Visit | Attending: Radiation Oncology | Admitting: Radiation Oncology

## 2015-06-11 DIAGNOSIS — C61 Malignant neoplasm of prostate: Secondary | ICD-10-CM | POA: Diagnosis not present

## 2015-06-11 DIAGNOSIS — I10 Essential (primary) hypertension: Secondary | ICD-10-CM | POA: Diagnosis not present

## 2015-06-12 ENCOUNTER — Ambulatory Visit
Admission: RE | Admit: 2015-06-12 | Discharge: 2015-06-12 | Disposition: A | Discharge status: Home / Self Care | Payer: Medicare Other | Source: Ambulatory Visit | Attending: Radiation Oncology | Admitting: Radiation Oncology

## 2015-06-12 DIAGNOSIS — C61 Malignant neoplasm of prostate: Secondary | ICD-10-CM | POA: Diagnosis not present

## 2015-06-12 DIAGNOSIS — I10 Essential (primary) hypertension: Secondary | ICD-10-CM | POA: Diagnosis not present

## 2015-06-13 ENCOUNTER — Ambulatory Visit
Admission: RE | Admit: 2015-06-13 | Discharge: 2015-06-13 | Disposition: A | Discharge status: Home / Self Care | Payer: Medicare Other | Source: Ambulatory Visit | Attending: Radiation Oncology | Admitting: Radiation Oncology

## 2015-06-13 DIAGNOSIS — I10 Essential (primary) hypertension: Secondary | ICD-10-CM | POA: Diagnosis not present

## 2015-06-13 DIAGNOSIS — C61 Malignant neoplasm of prostate: Secondary | ICD-10-CM | POA: Diagnosis not present

## 2015-06-14 ENCOUNTER — Ambulatory Visit
Admission: RE | Admit: 2015-06-14 | Discharge: 2015-06-14 | Disposition: A | Discharge status: Home / Self Care | Payer: Medicare Other | Source: Ambulatory Visit | Attending: Radiation Oncology | Admitting: Radiation Oncology

## 2015-06-14 ENCOUNTER — Encounter: Payer: Self-pay | Admitting: Radiation Oncology

## 2015-06-14 VITALS — BP 148/74 | HR 65 | Temp 98.2°F | Resp 20 | Wt 156.9 lb

## 2015-06-14 DIAGNOSIS — I10 Essential (primary) hypertension: Secondary | ICD-10-CM | POA: Diagnosis not present

## 2015-06-14 DIAGNOSIS — C61 Malignant neoplasm of prostate: Secondary | ICD-10-CM

## 2015-06-14 NOTE — Patient Instructions (Signed)
Contact our office if you have any questions following today's appointment: [phone].  

## 2015-06-14 NOTE — Progress Notes (Signed)
  Radiation Oncology         [phone]   Name: Austin Guerrero MRN: [id number]   Date: 06/14/2015  DOB: [birth year]   Weekly Radiation Therapy Management    ICD-9-CM ICD-10-CM   1. Prostate cancer ([place]) 185 C61     Current Dose: 33.15 Gy  Planned Dose:  78 Gy  Narrative The patient presents for routine under treatment assessment, and has completed 17/40 fractions.  On review of systems, the patient reports that he is doing quite well overall. He is not experiencing abdominal pain. He has 1-2 bowel movements per day and denies any diarrhea or hematochezia. He states that he needs to have some discomfort when he urinates but denies frank pain is not experiencing any hematuria or fatigue. No other complaints or verbalized.  Set-up films were reviewed. The chart was checked.  Physical Findings  weight is 156 lb 14.4 oz (71.169 kg). His oral temperature is 98.2 F (36.8 C). His blood pressure is 148/74 and his pulse is 65. His respiration is 20.   In general this is a well-appearing Caucasian male in no acute distress. He's alert and oriented 4 and appropriate throughout the examination. The pulmonary assessment reveals normal effort and no acute distress.   Impression Intermediate risk Stage T1c adenocarcinoma of the prostate with a Gleason's score of 3+4 and a PSA of 7.1 receiving external beam radiation therapy.   Plan Continue treatment as planned. The patient will keep [place] informed any changes in his symptoms prior to his next visit.     Sheral Apley Tammi Klippel, M.D.  This document serves as a record of services personally performed by Tyler Pita, MD. It was created on his behalf by Derek Mound, a trained medical scribe. The creation of this record is based on the scribe's personal observations and the provider's statements to them. This document has been checked and approved by the attending provider.

## 2015-06-14 NOTE — Progress Notes (Signed)
Weekly rad tx prostate, 17/40 completed, weak stream, frequency and urgency, no nocturia or hematuria , bowels moves 1-22 x day appetite good, no fatigue 8:51 AM BP 148/74 mmHg  Pulse 65  Temp(Src) 98.2 F (36.8 C) (Oral)  Resp 20  Wt 156 lb 14.4 oz (71.169 kg)  Wt Readings from Last 3 Encounters:  06/14/15 156 lb 14.4 oz (71.169 kg)  06/08/15 158 lb 1.6 oz (71.714 kg)  06/01/15 156 lb 11.2 oz (71.079 kg)

## 2015-06-15 ENCOUNTER — Ambulatory Visit
Admission: RE | Admit: 2015-06-15 | Discharge: 2015-06-15 | Disposition: A | Discharge status: Home / Self Care | Payer: Medicare Other | Source: Ambulatory Visit | Attending: Radiation Oncology | Admitting: Radiation Oncology

## 2015-06-15 DIAGNOSIS — C61 Malignant neoplasm of prostate: Secondary | ICD-10-CM | POA: Diagnosis not present

## 2015-06-15 DIAGNOSIS — I10 Essential (primary) hypertension: Secondary | ICD-10-CM | POA: Diagnosis not present

## 2015-06-18 ENCOUNTER — Ambulatory Visit
Admission: RE | Admit: 2015-06-18 | Discharge: 2015-06-18 | Disposition: A | Discharge status: Home / Self Care | Payer: Medicare Other | Source: Ambulatory Visit | Attending: Radiation Oncology | Admitting: Radiation Oncology

## 2015-06-18 DIAGNOSIS — I10 Essential (primary) hypertension: Secondary | ICD-10-CM | POA: Diagnosis not present

## 2015-06-18 DIAGNOSIS — C61 Malignant neoplasm of prostate: Secondary | ICD-10-CM | POA: Diagnosis not present

## 2015-06-19 ENCOUNTER — Ambulatory Visit
Admission: RE | Admit: 2015-06-19 | Discharge: 2015-06-19 | Disposition: A | Discharge status: Home / Self Care | Payer: Medicare Other | Source: Ambulatory Visit | Attending: Radiation Oncology | Admitting: Radiation Oncology

## 2015-06-19 DIAGNOSIS — I10 Essential (primary) hypertension: Secondary | ICD-10-CM | POA: Diagnosis not present

## 2015-06-19 DIAGNOSIS — C61 Malignant neoplasm of prostate: Secondary | ICD-10-CM | POA: Diagnosis not present

## 2015-06-20 ENCOUNTER — Ambulatory Visit
Admission: RE | Admit: 2015-06-20 | Discharge: 2015-06-20 | Disposition: A | Discharge status: Home / Self Care | Payer: Medicare Other | Source: Ambulatory Visit | Attending: Radiation Oncology | Admitting: Radiation Oncology

## 2015-06-20 DIAGNOSIS — I10 Essential (primary) hypertension: Secondary | ICD-10-CM | POA: Diagnosis not present

## 2015-06-20 DIAGNOSIS — C61 Malignant neoplasm of prostate: Secondary | ICD-10-CM | POA: Diagnosis not present

## 2015-06-21 ENCOUNTER — Ambulatory Visit
Admission: RE | Admit: 2015-06-21 | Discharge: 2015-06-21 | Disposition: A | Discharge status: Home / Self Care | Payer: Medicare Other | Source: Ambulatory Visit | Attending: Radiation Oncology | Admitting: Radiation Oncology

## 2015-06-21 ENCOUNTER — Encounter: Payer: Self-pay | Admitting: Radiation Oncology

## 2015-06-21 VITALS — BP 131/79 | HR 72 | Temp 98.2°F | Resp 20 | Wt 156.8 lb

## 2015-06-21 DIAGNOSIS — C61 Malignant neoplasm of prostate: Secondary | ICD-10-CM | POA: Diagnosis not present

## 2015-06-21 DIAGNOSIS — I10 Essential (primary) hypertension: Secondary | ICD-10-CM | POA: Diagnosis not present

## 2015-06-21 NOTE — Progress Notes (Signed)
  Radiation Oncology         [phone]   Name: Austin Guerrero MRN: [id number]   Date: 06/21/2015  DOB: 09-Oct-1945   Weekly Radiation Therapy Management    ICD-9-CM ICD-10-CM   1. Prostate cancer ([place]) 185 C61     Current Dose: 42.9 Gy  Planned Dose:  78 Gy  Narrative The patient presents for routine under treatment assessment..  Weekly rad txs prostate 22/40 completed. Increased dificulty starting his stream. Reports urgency,frequency, continued dysuria. Reports his nocturia is now 3x from 2x. Reports good bowels.  Set-up films were reviewed. The chart was checked.  Physical Findings  weight is 156 lb 12.8 oz (71.124 kg). His oral temperature is 98.2 F (36.8 C). His blood pressure is 131/79 and his pulse is 72. His respiration is 20.   In general this is a well-appearing Caucasian male in no acute distress. He's alert and oriented 4 and appropriate throughout the examination. The pulmonary assessment reveals normal effort and no acute distress.   Impression Intermediate risk Stage T1c adenocarcinoma of the prostate with a Gleason's score of 3+4 and a PSA of 7.1 receiving external beam radiation therapy.   Plan Continue treatment as planned. The patient will keep [place] informed any changes in his symptoms prior to his next visit.     Sheral Apley Tammi Klippel, M.D.  This document serves as a record of services personally performed by Tyler Pita, MD. It was created on his behalf by Darcus Austin, a trained medical scribe. The creation of this record is based on the scribe's personal observations and the provider's statements to them. This document has been checked and approved by the attending provider.

## 2015-06-21 NOTE — Progress Notes (Signed)
Weekly rad txs prostate 22/40 completed,  Increased dificulty starting his stream, urgency,frequency ,dysuria also still, nocturia 3x bowels good 8:50 AM BP 131/79 mmHg  Pulse 72  Temp(Src) 98.2 F (36.8 C) (Oral)  Resp 20  Wt 156 lb 12.8 oz (71.124 kg)  Wt Readings from Last 3 Encounters:  06/21/15 156 lb 12.8 oz (71.124 kg)  06/14/15 156 lb 14.4 oz (71.169 kg)  06/08/15 158 lb 1.6 oz (71.714 kg)

## 2015-06-22 ENCOUNTER — Ambulatory Visit
Admission: RE | Admit: 2015-06-22 | Discharge: 2015-06-22 | Disposition: A | Discharge status: Home / Self Care | Payer: Medicare Other | Source: Ambulatory Visit | Attending: Radiation Oncology | Admitting: Radiation Oncology

## 2015-06-22 DIAGNOSIS — C61 Malignant neoplasm of prostate: Secondary | ICD-10-CM | POA: Diagnosis not present

## 2015-06-22 DIAGNOSIS — I10 Essential (primary) hypertension: Secondary | ICD-10-CM | POA: Diagnosis not present

## 2015-06-25 ENCOUNTER — Ambulatory Visit
Admission: RE | Admit: 2015-06-25 | Discharge: 2015-06-25 | Disposition: A | Discharge status: Home / Self Care | Payer: Medicare Other | Source: Ambulatory Visit | Attending: Radiation Oncology | Admitting: Radiation Oncology

## 2015-06-25 DIAGNOSIS — C61 Malignant neoplasm of prostate: Secondary | ICD-10-CM | POA: Diagnosis not present

## 2015-06-25 DIAGNOSIS — I10 Essential (primary) hypertension: Secondary | ICD-10-CM | POA: Diagnosis not present

## 2015-06-26 ENCOUNTER — Ambulatory Visit
Admission: RE | Admit: 2015-06-26 | Discharge: 2015-06-26 | Disposition: A | Discharge status: Home / Self Care | Payer: Medicare Other | Source: Ambulatory Visit | Attending: Radiation Oncology | Admitting: Radiation Oncology

## 2015-06-26 DIAGNOSIS — I10 Essential (primary) hypertension: Secondary | ICD-10-CM | POA: Diagnosis not present

## 2015-06-26 DIAGNOSIS — C61 Malignant neoplasm of prostate: Secondary | ICD-10-CM | POA: Diagnosis not present

## 2015-06-27 ENCOUNTER — Ambulatory Visit
Admission: RE | Admit: 2015-06-27 | Discharge: 2015-06-27 | Disposition: A | Discharge status: Home / Self Care | Payer: Medicare Other | Source: Ambulatory Visit | Attending: Radiation Oncology | Admitting: Radiation Oncology

## 2015-06-27 DIAGNOSIS — C61 Malignant neoplasm of prostate: Secondary | ICD-10-CM | POA: Diagnosis not present

## 2015-06-27 DIAGNOSIS — I10 Essential (primary) hypertension: Secondary | ICD-10-CM | POA: Diagnosis not present

## 2015-06-28 ENCOUNTER — Ambulatory Visit
Admission: RE | Admit: 2015-06-28 | Discharge: 2015-06-28 | Disposition: A | Discharge status: Home / Self Care | Payer: Medicare Other | Source: Ambulatory Visit | Attending: Radiation Oncology | Admitting: Radiation Oncology

## 2015-06-28 ENCOUNTER — Encounter: Payer: Self-pay | Admitting: Medical Oncology

## 2015-06-28 DIAGNOSIS — I10 Essential (primary) hypertension: Secondary | ICD-10-CM | POA: Diagnosis not present

## 2015-06-28 DIAGNOSIS — C61 Malignant neoplasm of prostate: Secondary | ICD-10-CM | POA: Diagnosis not present

## 2015-06-28 NOTE — Progress Notes (Signed)
Oncology Nurse Navigator Documentation  Oncology Nurse Navigator Flowsheets 05/23/2015 06/04/2015 06/28/2015  Navigator Location - - -  Navigator Encounter Type Treatment Treatment Treatment- Mr. Beekman states he is doing well but he does have a few sides effects. He has trouble starting his stream but once he starts he is able to finish. He has nocturia  2-3 x a night. I explained to him if this gets worse Dr. Tammi Klippel can prescribe a medication to help. He states that he wants to wait and see if it gets worse. I will continue to follow.  Patient Visit Type - - -  Treatment Phase First Radiation Tx Treatment Treatment  Barriers/Navigation Needs No barriers at this time No barriers at this time No barriers at this time  Interventions None required None required -  Support Groups/Services Friends and Family Friends and Family -  Acuity Level 1 Level 1 Level 2  Acuity Level 1 Initial guidance, education and coordination as needed Minimal follow up required -  Acuity Level 2 - - Ongoing guidance and education throughout treatment as needed  Time Spent with Patient 15 15 15

## 2015-06-29 ENCOUNTER — Ambulatory Visit
Admission: RE | Admit: 2015-06-29 | Discharge: 2015-06-29 | Disposition: A | Discharge status: Home / Self Care | Payer: Medicare Other | Source: Ambulatory Visit | Attending: Radiation Oncology | Admitting: Radiation Oncology

## 2015-06-29 VITALS — BP 143/71 | HR 65 | Resp 16 | Wt 156.1 lb

## 2015-06-29 DIAGNOSIS — C61 Malignant neoplasm of prostate: Secondary | ICD-10-CM | POA: Diagnosis not present

## 2015-06-29 DIAGNOSIS — I10 Essential (primary) hypertension: Secondary | ICD-10-CM | POA: Diagnosis not present

## 2015-06-29 DIAGNOSIS — Z510 Encounter for antineoplastic radiation therapy: Secondary | ICD-10-CM | POA: Insufficient documentation

## 2015-06-29 NOTE — Progress Notes (Signed)
  Radiation Oncology         [phone]   Name: Austin Guerrero MRN: [id number]   Date: 06/29/2015  DOB: 1945/08/27     Weekly Radiation Therapy Management    ICD-9-CM ICD-10-CM   1. Prostate cancer ([place]) 185 C61     Current Dose: 54.6 Gy  Planned Dose:  78 Gy  Narrative The patient presents for routine under treatment assessment..  Weight and vitals stable. Denies pain. Reports unchanged mild hesitancy, urgency, frequency, and dysuria. Reports nocturia x 2-3. Denies diarrhea. Denies fatigue. Reports he continues to work out daily at the gym.  Set-up films were reviewed. The chart was checked.  Physical Findings  weight is 156 lb 1.6 oz (70.806 kg). His blood pressure is 143/71 and his pulse is 65. His respiration is 16 and oxygen saturation is 100%.   In general this is a well-appearing Caucasian male in no acute distress. He's alert and oriented 4 and appropriate throughout the examination.   Impression Intermediate risk Stage T1c adenocarcinoma of the prostate with a Gleason's score of 3+4 and a PSA of 7.1 receiving external beam radiation therapy.   Plan Continue treatment as planned. The patient will keep [place] informed any changes in his symptoms prior to his next visit.     Sheral Apley Tammi Klippel, M.D.  This document serves as a record of services personally performed by Tyler Pita, MD. It was created on his behalf by Arlyce Harman, a trained medical scribe. The creation of this record is based on the scribe's personal observations and the provider's statements to them. This document has been checked and approved by the attending provider.

## 2015-06-29 NOTE — Progress Notes (Signed)
Weight and vitals stable. Denies pain. Reports unchanged mild hesitancy, urgency, frequency, and dysuria. Reports nocturia x 2-3. Denies diarrhea. Denies fatigue. Reports he continues to work out daily at the gym.  BP 143/71 mmHg  Pulse 65  Resp 16  Wt 156 lb 1.6 oz (70.806 kg)  SpO2 100%  Wt Readings from Last 3 Encounters:  06/29/15 156 lb 1.6 oz (70.806 kg)  06/21/15 156 lb 12.8 oz (71.124 kg)  06/14/15 156 lb 14.4 oz (71.169 kg)

## 2015-07-02 ENCOUNTER — Ambulatory Visit
Admission: RE | Admit: 2015-07-02 | Discharge: 2015-07-02 | Disposition: A | Discharge status: Home / Self Care | Payer: Medicare Other | Source: Ambulatory Visit | Attending: Radiation Oncology | Admitting: Radiation Oncology

## 2015-07-02 DIAGNOSIS — C61 Malignant neoplasm of prostate: Secondary | ICD-10-CM | POA: Diagnosis not present

## 2015-07-03 ENCOUNTER — Ambulatory Visit
Admission: RE | Admit: 2015-07-03 | Discharge: 2015-07-03 | Disposition: A | Discharge status: Home / Self Care | Payer: Medicare Other | Source: Ambulatory Visit | Attending: Radiation Oncology | Admitting: Radiation Oncology

## 2015-07-03 DIAGNOSIS — C61 Malignant neoplasm of prostate: Secondary | ICD-10-CM | POA: Diagnosis not present

## 2015-07-04 ENCOUNTER — Ambulatory Visit
Admission: RE | Admit: 2015-07-04 | Discharge: 2015-07-04 | Disposition: A | Discharge status: Home / Self Care | Payer: Medicare Other | Source: Ambulatory Visit | Attending: Radiation Oncology | Admitting: Radiation Oncology

## 2015-07-04 DIAGNOSIS — R3915 Urgency of urination: Secondary | ICD-10-CM | POA: Diagnosis not present

## 2015-07-04 DIAGNOSIS — C61 Malignant neoplasm of prostate: Secondary | ICD-10-CM | POA: Insufficient documentation

## 2015-07-04 DIAGNOSIS — R350 Frequency of micturition: Secondary | ICD-10-CM | POA: Insufficient documentation

## 2015-07-04 DIAGNOSIS — Z510 Encounter for antineoplastic radiation therapy: Secondary | ICD-10-CM | POA: Insufficient documentation

## 2015-07-05 ENCOUNTER — Ambulatory Visit
Admission: RE | Admit: 2015-07-05 | Discharge: 2015-07-05 | Disposition: A | Discharge status: Home / Self Care | Payer: Medicare Other | Source: Ambulatory Visit | Attending: Radiation Oncology | Admitting: Radiation Oncology

## 2015-07-05 DIAGNOSIS — C61 Malignant neoplasm of prostate: Secondary | ICD-10-CM | POA: Diagnosis not present

## 2015-07-05 DIAGNOSIS — R350 Frequency of micturition: Secondary | ICD-10-CM | POA: Diagnosis not present

## 2015-07-05 DIAGNOSIS — Z510 Encounter for antineoplastic radiation therapy: Secondary | ICD-10-CM | POA: Diagnosis not present

## 2015-07-05 DIAGNOSIS — R3915 Urgency of urination: Secondary | ICD-10-CM | POA: Diagnosis not present

## 2015-07-06 ENCOUNTER — Ambulatory Visit
Admission: RE | Admit: 2015-07-06 | Discharge: 2015-07-06 | Disposition: A | Discharge status: Home / Self Care | Payer: Medicare Other | Source: Ambulatory Visit | Attending: Radiation Oncology | Admitting: Radiation Oncology

## 2015-07-06 ENCOUNTER — Ambulatory Visit
Admission: RE | Admit: 2015-07-06 | Discharge: 2015-07-06 | Disposition: A | Discharge status: Home / Self Care | Payer: Federal, State, Local not specified - PPO | Source: Ambulatory Visit | Attending: Radiation Oncology | Admitting: Radiation Oncology

## 2015-07-06 VITALS — BP 132/73 | HR 67 | Resp 16 | Wt 154.6 lb

## 2015-07-06 DIAGNOSIS — R3915 Urgency of urination: Secondary | ICD-10-CM | POA: Diagnosis not present

## 2015-07-06 DIAGNOSIS — R350 Frequency of micturition: Secondary | ICD-10-CM | POA: Diagnosis not present

## 2015-07-06 DIAGNOSIS — C61 Malignant neoplasm of prostate: Secondary | ICD-10-CM | POA: Diagnosis not present

## 2015-07-06 DIAGNOSIS — Z510 Encounter for antineoplastic radiation therapy: Secondary | ICD-10-CM | POA: Diagnosis not present

## 2015-07-06 NOTE — Progress Notes (Signed)
Weight and vitals stable. Denies pain. Reports hesitancy and dysuria are slightly worse this week but, more manageable. Reports urgency and frequency continue but are unchanged. Reports nocturia x 2. Denies diarrhea. Reports increased abdominal cramping which he manages with Gas X. Denies fatigue. Reports he continues to work out regularly.   BP 132/73 mmHg  Pulse 67  Resp 16  Wt 154 lb 9.6 oz (70.126 kg)  SpO2 100% Wt Readings from Last 3 Encounters:  07/06/15 154 lb 9.6 oz (70.126 kg)  06/29/15 156 lb 1.6 oz (70.806 kg)  06/21/15 156 lb 12.8 oz (71.124 kg)

## 2015-07-06 NOTE — Progress Notes (Signed)
  Radiation Oncology         [phone]   Name: Austin Guerrero MRN: [id number]   Date: 07/06/2015  DOB: [birth year]     Weekly Radiation Therapy Management    ICD-9-CM ICD-10-CM   1. Prostate cancer ([place]) 185 C61     Current Dose: 64.35 Gy  Planned Dose:  78 Gy  Narrative The patient presents for routine under treatment assessment..  Weight and vitals stable. Denies pain. Reports hesitancy and dysuria are slightly worse this week but, more manageable. Reports urgency and frequency continue but are unchanged. Reports nocturia x 2. Denies diarrhea. Reports increased abdominal cramping which he manages with Gas X. Denies fatigue. Reports he continues to work out regularly.  Set-up films were reviewed. The chart was checked.  Physical Findings  weight is 154 lb 9.6 oz (70.126 kg). His blood pressure is 132/73 and his pulse is 67. His respiration is 16 and oxygen saturation is 100%.   In general this is a well-appearing Caucasian male in no acute distress. He's alert and oriented 4 and appropriate throughout the examination.   Impression Intermediate risk Stage T1c adenocarcinoma of the prostate with a Gleason's score of 3+4 and a PSA of 7.1 receiving external beam radiation therapy.   Plan Continue treatment as planned.    Sheral Apley Tammi Klippel, M.D.  This document serves as a record of services personally performed by Tyler Pita, MD. It was created on his behalf by Lendon Collar, a trained medical scribe. The creation of this record is based on the scribe's personal observations and the provider's statements to them. This document has been checked and approved by the attending provider.

## 2015-07-09 ENCOUNTER — Ambulatory Visit
Admission: RE | Admit: 2015-07-09 | Discharge: 2015-07-09 | Disposition: A | Discharge status: Home / Self Care | Payer: Medicare Other | Source: Ambulatory Visit | Attending: Radiation Oncology | Admitting: Radiation Oncology

## 2015-07-09 DIAGNOSIS — R3915 Urgency of urination: Secondary | ICD-10-CM | POA: Diagnosis not present

## 2015-07-09 DIAGNOSIS — R350 Frequency of micturition: Secondary | ICD-10-CM | POA: Diagnosis not present

## 2015-07-09 DIAGNOSIS — Z510 Encounter for antineoplastic radiation therapy: Secondary | ICD-10-CM | POA: Diagnosis not present

## 2015-07-09 DIAGNOSIS — C61 Malignant neoplasm of prostate: Secondary | ICD-10-CM | POA: Diagnosis not present

## 2015-07-10 ENCOUNTER — Ambulatory Visit
Admission: RE | Admit: 2015-07-10 | Discharge: 2015-07-10 | Disposition: A | Discharge status: Home / Self Care | Payer: Medicare Other | Source: Ambulatory Visit | Attending: Radiation Oncology | Admitting: Radiation Oncology

## 2015-07-10 DIAGNOSIS — R350 Frequency of micturition: Secondary | ICD-10-CM | POA: Diagnosis not present

## 2015-07-10 DIAGNOSIS — Z510 Encounter for antineoplastic radiation therapy: Secondary | ICD-10-CM | POA: Diagnosis not present

## 2015-07-10 DIAGNOSIS — C61 Malignant neoplasm of prostate: Secondary | ICD-10-CM | POA: Diagnosis not present

## 2015-07-10 DIAGNOSIS — R3915 Urgency of urination: Secondary | ICD-10-CM | POA: Diagnosis not present

## 2015-07-11 ENCOUNTER — Ambulatory Visit
Admission: RE | Admit: 2015-07-11 | Discharge: 2015-07-11 | Disposition: A | Discharge status: Home / Self Care | Payer: Medicare Other | Source: Ambulatory Visit | Attending: Radiation Oncology | Admitting: Radiation Oncology

## 2015-07-11 DIAGNOSIS — C61 Malignant neoplasm of prostate: Secondary | ICD-10-CM | POA: Diagnosis not present

## 2015-07-11 DIAGNOSIS — Z510 Encounter for antineoplastic radiation therapy: Secondary | ICD-10-CM | POA: Diagnosis not present

## 2015-07-11 DIAGNOSIS — R3915 Urgency of urination: Secondary | ICD-10-CM | POA: Diagnosis not present

## 2015-07-11 DIAGNOSIS — R350 Frequency of micturition: Secondary | ICD-10-CM | POA: Diagnosis not present

## 2015-07-12 ENCOUNTER — Ambulatory Visit
Admission: RE | Admit: 2015-07-12 | Discharge: 2015-07-12 | Disposition: A | Discharge status: Home / Self Care | Payer: Medicare Other | Source: Ambulatory Visit | Attending: Radiation Oncology | Admitting: Radiation Oncology

## 2015-07-12 ENCOUNTER — Encounter: Payer: Self-pay | Admitting: Medical Oncology

## 2015-07-12 ENCOUNTER — Encounter: Payer: Self-pay | Admitting: Radiation Oncology

## 2015-07-12 VITALS — BP 134/77 | HR 62 | Resp 16 | Wt 153.8 lb

## 2015-07-12 DIAGNOSIS — R3915 Urgency of urination: Secondary | ICD-10-CM | POA: Diagnosis not present

## 2015-07-12 DIAGNOSIS — C61 Malignant neoplasm of prostate: Secondary | ICD-10-CM | POA: Diagnosis not present

## 2015-07-12 DIAGNOSIS — R350 Frequency of micturition: Secondary | ICD-10-CM | POA: Diagnosis not present

## 2015-07-12 DIAGNOSIS — Z510 Encounter for antineoplastic radiation therapy: Secondary | ICD-10-CM | POA: Diagnosis not present

## 2015-07-12 NOTE — Progress Notes (Signed)
  Radiation Oncology         [phone]   Name: Austin Guerrero MRN: [id number]   Date: 07/12/2015  DOB: [birth year]     Weekly Radiation Therapy Management    ICD-9-CM ICD-10-CM   1. Prostate cancer ([place]) 185 C61     Current Dose: 72.15 Gy  Planned Dose:  78 Gy  Narrative The patient presents for routine under treatment assessment..  Weight and vitals stable. Denies pain. Reports urgency, frequency, dysuria and hesitancy continue. Reports discomfort at the beginning of urination. Weak urinary stream. Reports nocturia x 2 on average but, that he didn't get up at all last night to void. Reports abdominal cramping has resolved. Denies diarrhea. Denies fatigue.  Set-up films were reviewed. The chart was checked.  Physical Findings  weight is 153 lb 12.8 oz (69.763 kg). His blood pressure is 134/77 and his pulse is 62. His respiration is 16 and oxygen saturation is 100%.   In general this is a well-appearing Caucasian male in no acute distress. He's alert and oriented 4 and appropriate throughout the examination.   Impression Intermediate risk Stage T1c adenocarcinoma of the prostate with a Gleason's score of 3+4 and a PSA of 7.1 receiving external beam radiation therapy.   Plan Continue treatment as planned. He was given a one month follow up appointment card.    Sheral Apley Tammi Klippel, M.D.  This document serves as a record of services personally performed by Tyler Pita, MD. It was created on his behalf by Arlyce Harman, a trained medical scribe. The creation of this record is based on the scribe's personal observations and the provider's statements to them. This document has been checked and approved by the attending provider.

## 2015-07-12 NOTE — Progress Notes (Addendum)
Weight and vitals stable. Denies pain. Reports urgency, frequency, dysuria and hesitancy continue. Reports nocturia x 2 on average but, that he didn't get up at all last night to void. Reports abdominal cramping has resolved. Denies diarrhea. Denies fatigue. One month follow up appointment card given. Patient understands to contact this RN with future needs.   BP 134/77 mmHg  Pulse 62  Resp 16  Wt 153 lb 12.8 oz (69.763 kg)  SpO2 100% Wt Readings from Last 3 Encounters:  07/12/15 153 lb 12.8 oz (69.763 kg)  07/06/15 154 lb 9.6 oz (70.126 kg)  06/29/15 156 lb 1.6 oz (70.806 kg)

## 2015-07-13 ENCOUNTER — Ambulatory Visit
Admission: RE | Admit: 2015-07-13 | Discharge: 2015-07-13 | Disposition: A | Discharge status: Home / Self Care | Payer: Medicare Other | Source: Ambulatory Visit | Attending: Radiation Oncology | Admitting: Radiation Oncology

## 2015-07-13 DIAGNOSIS — Z510 Encounter for antineoplastic radiation therapy: Secondary | ICD-10-CM | POA: Diagnosis not present

## 2015-07-13 DIAGNOSIS — R3915 Urgency of urination: Secondary | ICD-10-CM | POA: Diagnosis not present

## 2015-07-13 DIAGNOSIS — C61 Malignant neoplasm of prostate: Secondary | ICD-10-CM | POA: Diagnosis not present

## 2015-07-13 DIAGNOSIS — R350 Frequency of micturition: Secondary | ICD-10-CM | POA: Diagnosis not present

## 2015-07-16 ENCOUNTER — Ambulatory Visit
Admission: RE | Admit: 2015-07-16 | Discharge: 2015-07-16 | Disposition: A | Discharge status: Home / Self Care | Payer: Medicare Other | Source: Ambulatory Visit | Attending: Radiation Oncology | Admitting: Radiation Oncology

## 2015-07-16 ENCOUNTER — Ambulatory Visit: Payer: Medicare Other

## 2015-07-16 DIAGNOSIS — R3915 Urgency of urination: Secondary | ICD-10-CM | POA: Diagnosis not present

## 2015-07-16 DIAGNOSIS — R350 Frequency of micturition: Secondary | ICD-10-CM | POA: Diagnosis not present

## 2015-07-16 DIAGNOSIS — C61 Malignant neoplasm of prostate: Secondary | ICD-10-CM | POA: Diagnosis not present

## 2015-07-16 DIAGNOSIS — Z510 Encounter for antineoplastic radiation therapy: Secondary | ICD-10-CM | POA: Diagnosis not present

## 2015-07-17 ENCOUNTER — Ambulatory Visit
Admission: RE | Admit: 2015-07-17 | Discharge: 2015-07-17 | Disposition: A | Discharge status: Home / Self Care | Payer: Medicare Other | Source: Ambulatory Visit | Attending: Radiation Oncology | Admitting: Radiation Oncology

## 2015-07-17 ENCOUNTER — Encounter: Payer: Self-pay | Admitting: Radiation Oncology

## 2015-07-17 ENCOUNTER — Encounter: Payer: Self-pay | Admitting: Medical Oncology

## 2015-07-17 DIAGNOSIS — Z510 Encounter for antineoplastic radiation therapy: Secondary | ICD-10-CM | POA: Diagnosis not present

## 2015-07-17 DIAGNOSIS — R3915 Urgency of urination: Secondary | ICD-10-CM | POA: Diagnosis not present

## 2015-07-17 DIAGNOSIS — R350 Frequency of micturition: Secondary | ICD-10-CM | POA: Diagnosis not present

## 2015-07-17 DIAGNOSIS — C61 Malignant neoplasm of prostate: Secondary | ICD-10-CM | POA: Diagnosis not present

## 2015-07-17 NOTE — Progress Notes (Signed)
Oncology Nurse Navigator Documentation  Oncology Nurse Navigator Flowsheets 06/28/2015 07/11/2015 07/17/2015  Navigator Location - - -  Navigator Encounter Type Treatment Treatment Treatment  Patient Visit Type - - -  Treatment Phase Treatment Treatment Final Radiation Tx- Celebrated with Austin Guerrero and his wife as he rang the bell following his last radiation treatment. He will follow up with Shona Simpson, PA 4/18. I asked him to call me if he has questions or concerns. He voiced understanding.  Barriers/Navigation Needs No barriers at this time No barriers at this time No barriers at this time  Interventions - - -  Support Groups/Services - - -  Acuity Level 2 - Level 1  Acuity Level 1 - - Minimal follow up required  Acuity Level 2 Ongoing guidance and education throughout treatment as needed - -  Time Spent with Patient 15 15 15

## 2015-07-18 DIAGNOSIS — H401132 Primary open-angle glaucoma, bilateral, moderate stage: Secondary | ICD-10-CM | POA: Diagnosis not present

## 2015-07-25 DIAGNOSIS — H401131 Primary open-angle glaucoma, bilateral, mild stage: Secondary | ICD-10-CM | POA: Diagnosis not present

## 2015-08-03 NOTE — Progress Notes (Signed)
  Radiation Oncology         [phone] ________________________________  Name: Austin Guerrero MRN: [id number]  Date: 07/17/2015  DOB: 03/29/1946  End of Treatment Note   ICD-9-CM ICD-10-CM    1. Prostate cancer ([place]) 185 C61     DIAGNOSIS: 70 y.o. gentleman with stage T1c adenocarcinoma of the prostate with a Gleason's score of 3+4 and a PSA of 7.1     Indication for treatment:  Curative, Definitive Radiotherapy       Radiation treatment dates:   05/23/2015-07/17/2015  Site/dose:   The prostate was treated to 78 Gy in 40 fractions of 1.95 Gy  Beams/energy:   The patient was treated with IMRT using volumetric arc therapy delivering 6 MV X-rays to clockwise and counterclockwise circumferential arcs with a 90 degree collimator offset to avoid dose scalloping.  Image guidance was performed with daily cone beam CT prior to each fraction to align to gold markers in the prostate and assure proper bladder and rectal fill volumes.  Immobilization was achieved with BodyFix custom mold.  Narrative: The patient tolerated radiation treatment relatively well.   The patient experienced some modest fatigue and minor urinary irritation including urgency, frequency, nocturia, dysuria, and hesitancy.     Plan: The patient has completed radiation treatment. He will return to radiation oncology clinic for routine followup in one month. I advised him to call or return sooner if he has any questions or concerns related to his recovery or treatment. ________________________________  Sheral Apley. Tammi Klippel, M.D.  This document serves as a record of services personally performed by Tyler Pita, MD. It was created on his behalf by Arlyce Harman, a trained medical scribe. The creation of this record is based on the scribe's personal observations and the provider's statements to them. This document has been checked and approved by the attending provider.

## 2015-08-17 ENCOUNTER — Encounter (HOSPITAL_COMMUNITY): Payer: Self-pay

## 2015-08-17 ENCOUNTER — Encounter (HOSPITAL_COMMUNITY)
Admission: RE | Admit: 2015-08-17 | Discharge: 2015-08-17 | Disposition: A | Discharge status: Home / Self Care | Payer: Medicare Other | Source: Ambulatory Visit | Attending: General Surgery | Admitting: General Surgery

## 2015-08-17 DIAGNOSIS — Z01818 Encounter for other preprocedural examination: Secondary | ICD-10-CM | POA: Diagnosis not present

## 2015-08-17 LAB — CBC
HEMATOCRIT: 41.1 % (ref 39.0–52.0)
Hemoglobin: 14.2 g/dL (ref 13.0–17.0)
MCH: 32.5 pg (ref 26.0–34.0)
MCHC: 34.5 g/dL (ref 30.0–36.0)
MCV: 94.1 fL (ref 78.0–100.0)
PLATELETS: 246 10*3/uL (ref 150–400)
RBC: 4.37 MIL/uL (ref 4.22–5.81)
RDW: 12.4 % (ref 11.5–15.5)
WBC: 5.7 10*3/uL (ref 4.0–10.5)

## 2015-08-17 LAB — BASIC METABOLIC PANEL
Anion gap: 12 (ref 5–15)
BUN: 18 mg/dL (ref 6–20)
CALCIUM: 9.6 mg/dL (ref 8.9–10.3)
CHLORIDE: 99 mmol/L — AB (ref 101–111)
CO2: 27 mmol/L (ref 22–32)
Creatinine, Ser: 0.87 mg/dL (ref 0.61–1.24)
GFR calc Af Amer: >60 mL/min (ref >60)
GFR calc non Af Amer: >60 mL/min (ref >60)
GLUCOSE: 94 mg/dL (ref 65–99)
POTASSIUM: 4 mmol/L (ref 3.5–5.1)
SODIUM: 138 mmol/L (ref 135–145)

## 2015-08-17 NOTE — Pre-Procedure Instructions (Signed)
Austin Guerrero  08/17/2015      [facility] [place], [address] [place] [address] Phone: [phone] Fax: [phone]    Your procedure is scheduled on  Monday, April 24th   Report to [facility] Admitting at 5:30 AM             (Posted surgery time 7:15 - 8:45 am)   Call this number if you have problems the morning of surgery:  [phone]   Remember:  Do not eat food or drink liquids after midnight Sunday.  Take these medicines the morning of surgery with A SIP OF WATER : Zantac              4-5 days prior to surgery, STOP taking any herbal supplements, vitamins, anti-inflammatories)   Do not wear jewelry - no rings or watches.  Do not wear lotions or colognes.    You may NOT wear deodorant the day of surgery.              Men may shave face and neck.  Do not bring valuables to the hospital.  [facility] is not responsible for any belongings or valuables.  Contacts, dentures or bridgework may not be worn into surgery.  Leave your suitcase in the car.  After surgery it may be brought to your room.  For patients admitted to the hospital, discharge time will be determined by your treatment team. Patients discharged the day of surgery will not be allowed to drive home.   Name and phone number of your driver:     Please read over the following fact sheets that you were given. Pain Booklet, Coughing and Deep Breathing and Surgical Site Infection Prevention

## 2015-08-17 NOTE — Progress Notes (Addendum)
Unable to get a hold of [facility] office personnel to request orders -  They are closed for Good Friday holiday. Will place note for follow up.   PCP is Dr. Cari Caraway-- [place] 05/2015 Denies any cardiac issues, but did have some cardiac work up in preparation for his radiation & chemo treatments.-- which with radiation, he finished 4 weeks ago - total of 40.  Did see Dr. Ashok Norris for the work up 2016

## 2015-08-20 NOTE — Progress Notes (Signed)
Spoke with  Sunday Spillers at Dr.Wyatt's office to request orders.

## 2015-08-21 ENCOUNTER — Ambulatory Visit
Admission: RE | Admit: 2015-08-21 | Discharge: 2015-08-21 | Disposition: A | Discharge status: Home / Self Care | Payer: Medicare Other | Source: Ambulatory Visit | Attending: Radiation Oncology | Admitting: Radiation Oncology

## 2015-08-21 ENCOUNTER — Ambulatory Visit: Payer: Self-pay | Admitting: General Surgery

## 2015-08-21 ENCOUNTER — Encounter: Payer: Self-pay | Admitting: Radiation Oncology

## 2015-08-21 VITALS — BP 147/74 | HR 75 | Temp 97.7°F | Ht 66.0 in | Wt 150.9 lb

## 2015-08-21 DIAGNOSIS — C61 Malignant neoplasm of prostate: Secondary | ICD-10-CM

## 2015-08-21 NOTE — Progress Notes (Signed)
Radiation Oncology         [phone] ________________________________  Name: Austin Guerrero MRN: [id number]  Date: 08/21/2015  DOB: Jun 30, 1945  Follow-Up Visit Note  CC: Cari Caraway, MD  Raynelle Bring, MD  Diagnosis:   Stage T1c adenocarcinoma of the prostate with a Gleason's score of 3+4 and a PSA of 7.1.    ICD-9-CM ICD-10-CM   1. Prostate cancer ([facility]) 185 C61     Interval Since Last Radiation: 1 month  05/23/2015-07/17/2015: The prostate was treated to 78 Gy in 40 fractions of 1.95 Gy  Narrative:  The patient returns today for routine follow-up. Of note during the course of his radiotherapy, he did have several episodes of abdominal pain that resolved without any intervention, as well as dysuria, frequency, nocturia, and hesitancy.  On review of systems, the patient reports that overall he is doing quite well. He continues to have a few episodes of nocturia feels that this is back to baseline compared to where he was during treatment. He has noticed occasionally some urinary urgency but states that the frequency has significantly improved, as well as dysuria. He denies any hematuria, or bowel dysfunction. No other complaints or verbalized.                           ALLERGIES:  has No Known Allergies.  Meds: Current Outpatient Prescriptions  Medication Sig Dispense Refill  . aspirin 81 MG tablet Take 81 mg by mouth daily.    . Calcium Carbonate-Vitamin D (CALCIUM 600+D) 600-400 MG-UNIT per tablet Take 1 tablet by mouth 2 (two) times daily.     Marland Kitchen desloratadine (CLARINEX) 5 MG tablet Take 5 mg by mouth as needed. Reported on 06/14/2015    . hydrochlorothiazide (HYDRODIURIL) 25 MG tablet Take 25 mg by mouth daily.     . Multiple Vitamin (MULTIVITAMIN) tablet Take 1 tablet by mouth daily.    . ranitidine (ZANTAC) 150 MG tablet Take 150 mg by mouth daily.     . simethicone (MYLICON) 80 MG chewable tablet Chew 80 mg by mouth every 6 (six) hours as needed for flatulence (GAS X).       . vitamin C (ASCORBIC ACID) 500 MG tablet Take 500 mg by mouth daily.    Marland Kitchen atorvastatin (LIPITOR) 20 MG tablet Take 20 mg by mouth daily.      No current facility-administered medications for this encounter.    Physical Findings:  height is 5\' 6"  (1.676 m) and weight is 150 lb 14.4 oz (68.448 kg). His temperature is 97.7 F (36.5 C). His blood pressure is 147/74 and his pulse is 75.   Pain scale 0/10 In general this is a well appearing Caucasian male in no acute distress. He's alert and oriented x4 and appropriate throughout the examination. Cardiopulmonary assessment is negative for acute distress and he exhibits normal effort.    Lab Findings: Lab Results  Component Value Date   WBC 5.7 08/17/2015   HGB 14.2 08/17/2015   HCT 41.1 08/17/2015   PLT 246 08/17/2015    Lab Results  Component Value Date   NA 138 08/17/2015   K 4.0 08/17/2015   CO2 27 08/17/2015   GLUCOSE 94 08/17/2015   BUN 18 08/17/2015   CREATININE 0.87 08/17/2015   CALCIUM 9.6 08/17/2015   ANIONGAP 12 08/17/2015    Radiographic Findings: No results found.  Impression/Plan: 1. Stage T1c adenocarcinoma of the prostate with a Gleason's score of 3+4 and  a PSA of 7.1. The patient appears to be doing very well overall since completing his radiotherapy. We discussed the rationale for continuing close follow-up evaluation with [facility], and he will see Dr. Alinda Money in June. We would be happy to see him in the future should he have any additional radiation oncology need, or if he has questions or concerns regarding his prior therapy. 2. Survivorship. I offered the patient in evaluation with Survivorship clinic, however at this point he declined, and states that he feels very supportive and understands the resources available to him.     Carola Rhine, PAC

## 2015-08-21 NOTE — Progress Notes (Signed)
Mr. Austin Guerrero here for reassement s/p XRT for prostate cancer.  Reports urinary urgency, intermittent difficulty starting stream, empties completely without dribbling.  Nocturia x 0-1 times.  Denies any rectal irritation, nor loose stools  Dr. Alinda Money appt. On 10/24/15  Right hernia repair scheduled for Monday next week

## 2015-08-26 MED ORDER — CEFAZOLIN SODIUM-DEXTROSE 2-4 GM/100ML-% IV SOLN
2.0000 g | INTRAVENOUS | Status: AC
  Filled 2015-08-26: qty 100

## 2015-08-26 NOTE — H&P (Signed)
Austin Guerrero 05/08/2015 8:58 AM Location: [facility] Patient #: [id number] DOB: 1946-01-02 Married / Language: English / Race: White Male   History of Present Illness Austin Guerrero. Austin Skains MD; 05/08/2015 9:23 AM) The patient is a 70 year old male who presents with an inguinal hernia. The hernia(s) is/are located on the right side. Symptoms include inguinal pain and scrotal pain. The pain is located in the right inguinal area and in the right hemiscrotum. The pain radiates to the right hemiscrotum. The patient describes the pain as aching. Onset was 1 month(s) ago. This problem has not been previously evaluated.   Other Problems Multimedia programmer, RN, BSN; 05/08/2015 8:58 AM) Gastroesophageal Reflux Disease Hemorrhoids High blood pressure Hypercholesterolemia Inguinal Hernia Other disease, cancer, significant illness Prostate Cancer  Past Surgical History Austin Leventhal, RN, BSN; 05/08/2015 8:58 AM) Colon Polyp Removal - Colonoscopy Oral Surgery Tonsillectomy Vasectomy  Diagnostic Studies History Austin Leventhal, RN, BSN; 05/08/2015 8:58 AM) Colonoscopy 1-5 years ago  Allergies Austin Leventhal, RN, BSN; 05/08/2015 9:00 AM) No Known Drug Allergies01/07/2015  Medication History (Austin Leventhal, RN, BSN; 05/08/2015 9:02 AM) Atorvastatin Calcium (20MG  Tablet, Oral daily) Active. HydroCHLOROthiazide (25MG  Tablet, Oral daily) Active. Omeprazole (20MG  Capsule DR, Oral daily) Active. Aspirin (81MG  Tablet Chewable, Oral daily) Active. Calcium 600 + D (600-200MG -UNIT Tablet, Oral daily) Active. Clarinex (5MG  Tablet, Oral as needed) Active. Vitamin C (100MG  Tablet Chewable, Oral daily) Active. Medications Reconciled  Social History (Austin Leventhal, RN, BSN; 05/08/2015 8:58 AM) Alcohol use Moderate alcohol use. Caffeine use Carbonated beverages, Coffee. No drug use Tobacco use Never smoker.  Family History (Austin Leventhal, RN, BSN; 05/08/2015 8:58  AM) Arthritis Father. Breast Cancer Mother. Cancer Father, Mother. Heart Disease Mother. Hypertension Mother. Prostate Cancer Father. Respiratory Condition Father.    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 Skin Not Present- Change in Wart/Mole, Dryness, Hives, Jaundice, New Lesions, Non-Healing Wounds, Rash and Ulcer. HEENT Present- Seasonal Allergies and Wears glasses/contact lenses. Not Present- Earache, Hearing Loss, Hoarseness, Nose Bleed, Oral Ulcers, Ringing in the Ears, Sinus Pain, Sore Throat, Visual Disturbances and Yellow Eyes. Respiratory Not Present- Bloody sputum, Chronic Cough, Difficulty Breathing, Snoring and Wheezing. Breast Not Present- Breast Mass, Breast Pain, Nipple Discharge and Skin Changes. Cardiovascular Not Present- Chest Pain, Difficulty Breathing Lying Down, Leg Cramps, Palpitations, Rapid Heart Rate, Shortness of Breath and Swelling of Extremities. Gastrointestinal Present- Hemorrhoids and Indigestion. Not Present- Abdominal Pain, Bloating, Bloody Stool, Change in Bowel Habits, Chronic diarrhea, Constipation, Difficulty Swallowing, Excessive gas, Gets full quickly at meals, Nausea, Rectal Pain and Vomiting. Male Genitourinary Present- Impotence. Not Present- Blood in Urine, Change in Urinary Stream, Frequency, Nocturia, Painful Urination, Urgency and Urine Leakage. Musculoskeletal Not Present- Back Pain, Joint Pain, Joint Stiffness, Muscle Pain, Muscle Weakness and Swelling of Extremities. Neurological Not Present- Decreased Memory, Fainting, Headaches, Numbness, Seizures, Tingling, Tremor, Trouble walking and Weakness. Psychiatric Not Present- Anxiety, Bipolar, Change in Sleep Pattern, Depression, Fearful and Frequent crying. Endocrine Not Present- Cold Intolerance, Excessive Hunger, Hair Changes, Heat Intolerance, Hot flashes and  New Diabetes. Hematology Not Present- Easy Bruising, Excessive bleeding, Gland problems, HIV and Persistent Infections.  Vitals Occupational hygienist, BSN; 05/08/2015 8:59 AM) 05/08/2015 8:59 AM Weight: 158.4 lb Height: 66in Body Surface Area: 1.81 m Body Mass Index: 25.57 kg/m  Temp.: 98.14F(Oral)  Pulse: 81 (Regular)  BP: 162/82 (Sitting, Left Arm, Standard)  Current vital signs: P  63  BP  143/85     Physical Exam (Austin Guerrero O. Austin Skains MD; 05/08/2015 9:26 AM) General Mental Status-Alert. General Appearance-Cooperative and Well groomed. Note: Looks younger than stated age Orientation-Oriented X4. Build & Nutrition-Well nourished.  Chest and Lung Exam Chest and lung exam reveals -normal excursion with symmetric chest walls, quiet, even and easy respiratory effort with no use of accessory muscles, normal resonance, no flatness or dullness, non-tender and normal tactile fremitus and on auscultation, normal breath sounds, no adventitious sounds and normal vocal resonance.  Cardiovascular Cardiovascular examination reveals -normal heart sounds, regular rate and rhythm with no murmurs and (see Vital Signs section for blood pressure measurements).  Abdomen Inspection Hernias - Inguinal hernia - Right - Reducible(Minimally sympomatic. Easily reducible. Examinaed the left side and no palpable hernia). Auscultation Auscultation of the abdomen reveals - Bowel sounds normal.    Assessment & Plan Austin Guerrero O. Austin Plunk MD; 05/08/2015 9:29 AM) RIGHT INGUINAL HERNIA (K40.90) Story: Started having discomfort about one month ago. Aches sometimes. Easily reducible. Noever incarcerated. Impression: Patient to undergo XRT for Prostate CA. This will take until aproximately early to mid March. It wis advised to wait until after that for repair of hernia. Laparoscopic repair recommended. Risk and benefits discussed. Current Plans Pt Education - Pamphlet Given - Laparoscopic Hernia Repair:  discussed with patient and provided information. Pt Education - Pamphlet Given - Hernia Surgery: discussed with patient and provided information.  Addendum:  No  Additional questions.  Has become a bit larger.  Area marked for surgery.  Plan for laparoscopic [facility] repair with mesh.  Austin Guerrero. Dahlia Bailiff, MD, [place] [phone] [facility]

## 2015-08-26 NOTE — Anesthesia Preprocedure Evaluation (Addendum)
Anesthesia Evaluation  Patient identified by MRN, date of birth, ID band Patient awake    Reviewed: Allergy & Precautions, NPO status , Patient's Chart, lab work & pertinent test results  Airway Mallampati: I       Dental  (+) Teeth Intact   Pulmonary neg pulmonary ROS,    breath sounds clear to auscultation       Cardiovascular hypertension, Pt. on medications  Rhythm:Regular Rate:Normal     Neuro/Psych negative neurological ROS  negative psych ROS   GI/Hepatic Neg liver ROS, GERD  ,  Endo/Other  negative endocrine ROS  Renal/GU negative Renal ROS  negative genitourinary   Musculoskeletal negative musculoskeletal ROS (+)   Abdominal Normal abdominal exam  (+)   Peds  Hematology negative hematology ROS (+)   Anesthesia Other Findings   Reproductive/Obstetrics negative OB ROS                           08/2015 EKG: normal sinus rhythm.   Anesthesia Physical Anesthesia Plan  ASA: II  Anesthesia Plan: General   Post-op Pain Management:    Induction: Intravenous  Airway Management Planned: Oral ETT  Additional Equipment:   Intra-op Plan:   Post-operative Plan: Extubation in OR  Informed Consent: I have reviewed the patients History and Physical, chart, labs and discussed the procedure including the risks, benefits and alternatives for the proposed anesthesia with the patient or authorized representative who has indicated his/her understanding and acceptance.   Dental advisory given  Plan Discussed with: CRNA  Anesthesia Plan Comments:         Anesthesia Quick Evaluation

## 2015-08-27 ENCOUNTER — Encounter (HOSPITAL_COMMUNITY): Payer: Self-pay | Admitting: *Deleted

## 2015-08-27 ENCOUNTER — Ambulatory Visit (HOSPITAL_COMMUNITY)
Admission: RE | Admit: 2015-08-27 | Discharge: 2015-08-27 | Disposition: A | Discharge status: Home / Self Care | Payer: Medicare Other | Source: Ambulatory Visit | Attending: General Surgery | Admitting: General Surgery

## 2015-08-27 ENCOUNTER — Ambulatory Visit (HOSPITAL_COMMUNITY): Payer: Medicare Other | Admitting: Anesthesiology

## 2015-08-27 ENCOUNTER — Ambulatory Visit (HOSPITAL_COMMUNITY): Payer: Medicare Other

## 2015-08-27 ENCOUNTER — Encounter (HOSPITAL_COMMUNITY)
Admission: RE | Disposition: A | Discharge status: Home / Self Care | Payer: Self-pay | Source: Ambulatory Visit | Attending: General Surgery

## 2015-08-27 DIAGNOSIS — C61 Malignant neoplasm of prostate: Secondary | ICD-10-CM | POA: Diagnosis not present

## 2015-08-27 DIAGNOSIS — I10 Essential (primary) hypertension: Secondary | ICD-10-CM | POA: Diagnosis not present

## 2015-08-27 DIAGNOSIS — Z01818 Encounter for other preprocedural examination: Secondary | ICD-10-CM

## 2015-08-27 DIAGNOSIS — G8918 Other acute postprocedural pain: Secondary | ICD-10-CM | POA: Diagnosis not present

## 2015-08-27 DIAGNOSIS — K4090 Unilateral inguinal hernia, without obstruction or gangrene, not specified as recurrent: Secondary | ICD-10-CM | POA: Insufficient documentation

## 2015-08-27 DIAGNOSIS — Z8546 Personal history of malignant neoplasm of prostate: Secondary | ICD-10-CM | POA: Diagnosis not present

## 2015-08-27 HISTORY — PX: INSERTION OF MESH: SHX5868

## 2015-08-27 HISTORY — PX: INGUINAL HERNIA REPAIR: SHX194

## 2015-08-27 LAB — HEPATIC FUNCTION PANEL
ALK PHOS: 69 U/L (ref 38–126)
ALT: 24 U/L (ref 17–63)
AST: 22 U/L (ref 15–41)
Albumin: 3.8 g/dL (ref 3.5–5.0)
Bilirubin, Direct: <0.1 mg/dL — ABNORMAL LOW (ref 0.1–0.5)
TOTAL PROTEIN: 6.4 g/dL — AB (ref 6.5–8.1)
Total Bilirubin: 0.7 mg/dL (ref 0.3–1.2)

## 2015-08-27 SURGERY — REPAIR, HERNIA, INGUINAL, LAPAROSCOPIC
Anesthesia: General | Site: Abdomen | Laterality: Right

## 2015-08-27 MED ORDER — BUPIVACAINE-EPINEPHRINE 0.25% -1:200000 IJ SOLN
INTRAMUSCULAR | Status: DC | PRN
  Administered 2015-08-27: 10 mL

## 2015-08-27 MED ORDER — 0.9 % SODIUM CHLORIDE (POUR BTL) OPTIME
TOPICAL | Status: DC | PRN
  Administered 2015-08-27: 1000 mL

## 2015-08-27 MED ORDER — ACETAMINOPHEN 10 MG/ML IV SOLN
1000.0000 mg | Freq: Once | INTRAVENOUS | Status: AC
  Administered 2015-08-27 (×2): 1000 mg via INTRAVENOUS

## 2015-08-27 MED ORDER — HYDROCODONE-ACETAMINOPHEN 5-325 MG PO TABS: 1.0000 | ORAL_TABLET | ORAL | Status: DC | PRN

## 2015-08-27 MED ORDER — PROPOFOL 10 MG/ML IV BOLUS
INTRAVENOUS | Status: DC | PRN
  Administered 2015-08-27: 150 mg via INTRAVENOUS

## 2015-08-27 MED ORDER — NEOSTIGMINE METHYLSULFATE 10 MG/10ML IV SOLN
INTRAVENOUS | Status: DC | PRN
  Administered 2015-08-27: 3 mg via INTRAVENOUS

## 2015-08-27 MED ORDER — PROPOFOL 10 MG/ML IV BOLUS
INTRAVENOUS | Status: AC
  Filled 2015-08-27: qty 20

## 2015-08-27 MED ORDER — SODIUM CHLORIDE 0.9 % IR SOLN
Status: DC | PRN
  Administered 2015-08-27: 500 mL

## 2015-08-27 MED ORDER — ROCURONIUM BROMIDE 100 MG/10ML IV SOLN
INTRAVENOUS | Status: DC | PRN
  Administered 2015-08-27: 40 mg via INTRAVENOUS
  Administered 2015-08-27: 10 mg via INTRAVENOUS

## 2015-08-27 MED ORDER — GLYCOPYRROLATE 0.2 MG/ML IJ SOLN
INTRAMUSCULAR | Status: AC
  Filled 2015-08-27: qty 2

## 2015-08-27 MED ORDER — LIDOCAINE HCL (CARDIAC) 20 MG/ML IV SOLN
INTRAVENOUS | Status: DC | PRN
  Administered 2015-08-27: 60 mg via INTRAVENOUS

## 2015-08-27 MED ORDER — LIDOCAINE HCL (CARDIAC) 20 MG/ML IV SOLN
INTRAVENOUS | Status: AC
  Filled 2015-08-27: qty 5

## 2015-08-27 MED ORDER — BUPIVACAINE-EPINEPHRINE (PF) 0.5% -1:200000 IJ SOLN
INTRAMUSCULAR | Status: DC | PRN
  Administered 2015-08-27: 30 mL

## 2015-08-27 MED ORDER — FENTANYL CITRATE (PF) 250 MCG/5ML IJ SOLN
INTRAMUSCULAR | Status: AC
  Filled 2015-08-27: qty 5

## 2015-08-27 MED ORDER — SODIUM CHLORIDE 0.9 % IV SOLN
10.0000 mg | INTRAVENOUS | Status: DC | PRN
  Administered 2015-08-27: 15 ug/min via INTRAVENOUS

## 2015-08-27 MED ORDER — EPHEDRINE SULFATE 50 MG/ML IJ SOLN
INTRAMUSCULAR | Status: AC
  Filled 2015-08-27: qty 1

## 2015-08-27 MED ORDER — HYDROMORPHONE HCL 1 MG/ML IJ SOLN
0.2500 mg | INTRAMUSCULAR | Status: DC | PRN
  Administered 2015-08-27 (×2): 0.25 mg via INTRAVENOUS

## 2015-08-27 MED ORDER — FLEET ENEMA 7-19 GM/118ML RE ENEM: 1.0000 | ENEMA | Freq: Once | RECTAL | Status: DC

## 2015-08-27 MED ORDER — BUPIVACAINE-EPINEPHRINE (PF) 0.25% -1:200000 IJ SOLN
INTRAMUSCULAR | Status: AC
  Filled 2015-08-27: qty 30

## 2015-08-27 MED ORDER — DEXAMETHASONE SODIUM PHOSPHATE 4 MG/ML IJ SOLN
INTRAMUSCULAR | Status: DC | PRN
  Administered 2015-08-27: 4 mg via INTRAVENOUS

## 2015-08-27 MED ORDER — GLYCOPYRROLATE 0.2 MG/ML IJ SOLN
INTRAMUSCULAR | Status: DC | PRN
  Administered 2015-08-27: 0.4 mg via INTRAVENOUS
  Administered 2015-08-27: 0.2 mg via INTRAVENOUS

## 2015-08-27 MED ORDER — HYDROMORPHONE HCL 1 MG/ML IJ SOLN
INTRAMUSCULAR | Status: AC
  Filled 2015-08-27: qty 1

## 2015-08-27 MED ORDER — ROCURONIUM BROMIDE 50 MG/5ML IV SOLN
INTRAVENOUS | Status: AC
  Filled 2015-08-27: qty 1

## 2015-08-27 MED ORDER — FENTANYL CITRATE (PF) 100 MCG/2ML IJ SOLN
INTRAMUSCULAR | Status: DC | PRN
  Administered 2015-08-27 (×5): 50 ug via INTRAVENOUS

## 2015-08-27 MED ORDER — LACTATED RINGERS IV SOLN
INTRAVENOUS | Status: DC | PRN
  Administered 2015-08-27 (×2): via INTRAVENOUS

## 2015-08-27 MED ORDER — EPHEDRINE SULFATE 50 MG/ML IJ SOLN
5.0000 mg | Freq: Once | INTRAMUSCULAR | Status: AC
  Administered 2015-08-27: 5 mg via INTRAVENOUS

## 2015-08-27 MED ORDER — GLYCOPYRROLATE 0.2 MG/ML IJ SOLN
INTRAMUSCULAR | Status: AC
  Filled 2015-08-27: qty 1

## 2015-08-27 MED ORDER — MIDAZOLAM HCL 2 MG/2ML IJ SOLN
INTRAMUSCULAR | Status: AC
  Filled 2015-08-27: qty 2

## 2015-08-27 MED ORDER — ACETAMINOPHEN 10 MG/ML IV SOLN
INTRAVENOUS | Status: AC
  Filled 2015-08-27: qty 100

## 2015-08-27 MED ORDER — ONDANSETRON HCL 4 MG/2ML IJ SOLN
INTRAMUSCULAR | Status: AC
  Filled 2015-08-27: qty 2

## 2015-08-27 MED ORDER — SODIUM CHLORIDE 0.9 % IR SOLN
Status: DC | PRN
  Administered 2015-08-27: 1000 mL

## 2015-08-27 MED ORDER — CEFAZOLIN SODIUM-DEXTROSE 2-3 GM-% IV SOLR
INTRAVENOUS | Status: DC | PRN
  Administered 2015-08-27: 2 g via INTRAVENOUS

## 2015-08-27 MED ORDER — MEPERIDINE HCL 25 MG/ML IJ SOLN: 6.2500 mg | INTRAMUSCULAR | Status: DC | PRN

## 2015-08-27 MED ORDER — MIDAZOLAM HCL 5 MG/5ML IJ SOLN
INTRAMUSCULAR | Status: DC | PRN
  Administered 2015-08-27: 1 mg via INTRAVENOUS

## 2015-08-27 MED ORDER — LACTATED RINGERS IV SOLN: INTRAVENOUS | Status: DC

## 2015-08-27 MED ORDER — DEXAMETHASONE SODIUM PHOSPHATE 4 MG/ML IJ SOLN
INTRAMUSCULAR | Status: AC
  Filled 2015-08-27: qty 1

## 2015-08-27 MED ORDER — ONDANSETRON HCL 4 MG/2ML IJ SOLN
INTRAMUSCULAR | Status: DC | PRN
Start: 2015-08-27 — End: 2015-08-27
  Administered 2015-08-27: 4 mg via INTRAVENOUS

## 2015-08-27 SURGICAL SUPPLY — 46 items
APPLIER CLIP 5 13 M/L LIGAMAX5 (MISCELLANEOUS) ×3
BAG DECANTER FOR FLEXI CONT (MISCELLANEOUS) ×3 IMPLANT
CHLORAPREP W/TINT 26ML (MISCELLANEOUS) ×3 IMPLANT
CLIP APPLIE 5 13 M/L LIGAMAX5 (MISCELLANEOUS) ×1 IMPLANT
CLOSURE WOUND 1/2 X4 (GAUZE/BANDAGES/DRESSINGS) ×1
COVER SURGICAL LIGHT HANDLE (MISCELLANEOUS) ×3 IMPLANT
DECANTER SPIKE VIAL GLASS SM (MISCELLANEOUS) ×3 IMPLANT
DERMABOND ADVANCED (GAUZE/BANDAGES/DRESSINGS) ×2
DERMABOND ADVANCED .7 DNX12 (GAUZE/BANDAGES/DRESSINGS) ×1 IMPLANT
DEVICE SECURE STRAP 25 ABSORB (INSTRUMENTS) IMPLANT
DISSECT BALLN SPACEMKR + OVL (BALLOONS) ×3
DISSECTOR BALLN SPACEMKR + OVL (BALLOONS) ×1 IMPLANT
DISSECTOR BLUNT TIP ENDO 5MM (MISCELLANEOUS) IMPLANT
DRAPE LAPAROSCOPIC ABDOMINAL (DRAPES) IMPLANT
ELECT REM PT RETURN 9FT ADLT (ELECTROSURGICAL) ×3
ELECTRODE REM PT RTRN 9FT ADLT (ELECTROSURGICAL) ×1 IMPLANT
ENDOLOOP SUT PDS II  0 18 (SUTURE) ×2
ENDOLOOP SUT PDS II 0 18 (SUTURE) ×1 IMPLANT
GLOVE BIO SURGEON STRL SZ 6.5 (GLOVE) ×2 IMPLANT
GLOVE BIO SURGEONS STRL SZ 6.5 (GLOVE) ×1
GLOVE BIOGEL PI IND STRL 8 (GLOVE) ×1 IMPLANT
GLOVE BIOGEL PI INDICATOR 8 (GLOVE) ×2
GLOVE ECLIPSE 7.5 STRL STRAW (GLOVE) ×3 IMPLANT
GLOVE SURG SS PI 6.5 STRL IVOR (GLOVE) ×3 IMPLANT
GLOVE SURG SS PI 8.0 STRL IVOR (GLOVE) ×3 IMPLANT
GOWN STRL REUS W/ TWL LRG LVL3 (GOWN DISPOSABLE) ×3 IMPLANT
GOWN STRL REUS W/TWL LRG LVL3 (GOWN DISPOSABLE) ×6
KIT BASIN OR (CUSTOM PROCEDURE TRAY) ×3 IMPLANT
KIT ROOM TURNOVER OR (KITS) ×3 IMPLANT
LIQUID BAND (GAUZE/BANDAGES/DRESSINGS) ×3 IMPLANT
MESH 3DMAX 3X5 RT MED (Mesh General) ×3 IMPLANT
NEEDLE INSUFFLATION 14GA 120MM (NEEDLE) ×3 IMPLANT
NS IRRIG 1000ML POUR BTL (IV SOLUTION) ×3 IMPLANT
PAD ARMBOARD 7.5X6 YLW CONV (MISCELLANEOUS) IMPLANT
PENCIL BUTTON HOLSTER BLD 10FT (ELECTRODE) IMPLANT
SCISSORS LAP 5X35 DISP (ENDOMECHANICALS) ×3 IMPLANT
SET IRRIG TUBING LAPAROSCOPIC (IRRIGATION / IRRIGATOR) ×3 IMPLANT
SET TROCAR LAP APPLE-HUNT 5MM (ENDOMECHANICALS) ×3 IMPLANT
STRIP CLOSURE SKIN 1/2X4 (GAUZE/BANDAGES/DRESSINGS) ×2 IMPLANT
SUT MNCRL AB 4-0 PS2 18 (SUTURE) ×3 IMPLANT
TOWEL OR 17X24 6PK STRL BLUE (TOWEL DISPOSABLE) IMPLANT
TOWEL OR 17X26 10 PK STRL BLUE (TOWEL DISPOSABLE) ×3 IMPLANT
TRAY FOLEY CATH 14FRSI W/METER (CATHETERS) IMPLANT
TRAY LAPAROSCOPIC MC (CUSTOM PROCEDURE TRAY) ×3 IMPLANT
TUBING INSUFFLATION (TUBING) ×3 IMPLANT
WATER STERILE IRR 1000ML POUR (IV SOLUTION) IMPLANT

## 2015-08-27 NOTE — Op Note (Signed)
OPERATIVE REPORT  DATE OF OPERATION: 08/27/2015  PATIENT:  Austin Guerrero  70 y.o. male  PRE-OPERATIVE DIAGNOSIS:  Symptomatic right inguinal hernia  POST-OPERATIVE DIAGNOSIS:  Symptomatic right inguinal hernia  FINDINGS:  Large right indirect inguinal hernia  PROCEDURE:  Procedure(s): LAPAROSCOPIC RIGHT  INGUINAL HERNIA REPAIR INSERTION OF MESH  SURGEON:  Surgeon(s): Judeth Horn, MD  ASSISTANT: None  ANESTHESIA:   general  COMPLICATIONS:  None  EBL: <20 ml  BLOOD ADMINISTERED: none  DRAINS: none   SPECIMEN:  No Specimen  COUNTS CORRECT:  YES  PROCEDURE DETAILS: The patient was taken to the operating room and placed on the table in the supine position. After an adequate general endotracheal anesthetic was administered he was prepped and draped in usual sterile mannerexposing the entire abdomen.  A proper timeout was performed identifying the patient and procedure to be performed.  A right paraumbilical transverse incision was made using a #15 blade and taken down to the anterior rectus sheath.We transversely incised into the rectus sheath and split the rectus muscle down to the posterior fascia. It was in that plane that the dissecting balloon with attached camera and light source was passed down to the right pubic crest and the balloon insufflated with air from the bulb insufflator. The preperitoneal space was dissected off freed we subsequently removed the camera in the balloon.We inflated the operating port balloon with a syringe and then insufflated the preperitoneal space with carbon dioxide gas up to a maximal intra-abdominal pressure of 15 mmHg.  It was apparent from the insufflation that they peritoneal cavity was also inflating and distending, and therefore there may have been a small rent in the peritoneum during the dissection with the balloon. A Veress needle was placed on the left side in the paraumbilical area after an 11 blade was used to make a small  incision. This helped decompress perineal cavity and mild for preperitoneal dissection.  Two 5 mm dissecting and operating ports were placed one approximately 8 cm below the paraumbilical camera site and one in the suprapubic area.  We subsequent to use instruments to dissect out the spermatic cord on right side what appeared to be a large indirect sac going into the hernia defect. We were able to dissect out this hernia sac free and pull it all the way back to the base of the spermatic cord without injuring the cortisol. We circumferentially dissected out the spermatic cord.  The hernia sac was twisted and then subsequently ligated at its base using anEndoloop tie. We subsequent to use a 3-D ventral inguinal mesh for the right side medium-size attach it to the pubic tubercleand then above the hernia defect and then tucked laterally.We stapled it using a secure strap stapler around the inferior epigastric vessels on the anterior abdominal wall. And also attach it to the pubic ramus. We brought out the twisted and ligated hernia sac and placed it between the peritoneal space and the mesh in order to prevent recurrence.  We allowed the gas and fluid to be aspirated out of the aspirator and the preperitoneal space to close.  Once this was done 0.25% Marcaine was injected at all sites was with epinephrine. We closed using Dermabond Steri-Strips and Tegaderm and at the periumbilical site were close the fascia using figure-of-eight stitches of 0 Vicryl and then skin was closed using running subcuticular stitch of 4-0 Monocryl. All needle counts, sponge counts, and his main counts were correct.  PATIENT DISPOSITION:  PACU - hemodynamically stable.  Austin Guerrero 4/24/20179:08 AM

## 2015-08-27 NOTE — Anesthesia Postprocedure Evaluation (Signed)
Anesthesia Post Note  Patient: Austin Guerrero  Procedure(s) Performed: Procedure(s) (LRB): LAPAROSCOPIC RIGHT  INGUINAL HERNIA REPAIR (Right) INSERTION OF MESH (Right)  Patient location during evaluation: PACU Anesthesia Type: General and Regional Level of consciousness: awake and alert Pain management: pain level controlled Vital Signs Assessment: post-procedure vital signs reviewed and stable Respiratory status: spontaneous breathing, nonlabored ventilation, respiratory function stable and patient connected to nasal cannula oxygen Cardiovascular status: blood pressure returned to baseline and stable Postop Assessment: no signs of nausea or vomiting Anesthetic complications: no    Last Vitals:  Filed Vitals:   08/27/15 1152 08/27/15 1207  BP: 104/67 106/51  Pulse: 56   Temp:    Resp: 9 10    Last Pain:  Filed Vitals:   08/27/15 1213  PainSc: [place] [name]

## 2015-08-27 NOTE — Discharge Instructions (Signed)
Laparoscopic Inguinal Hernia Repair, Care After Refer to this sheet in the next few weeks. These instructions provide you with information on caring for yourself after your procedure. Your health care provider may also give you more specific instructions. Your treatment has been planned according to current medical practices, but problems sometimes occur. Call your health care provider if you have any problems or questions after your procedure. WHAT TO EXPECT AFTER THE PROCEDURE After your procedure, it is typical to have the following:  Pain in your abdomen, especially along your incision. You will be given pain medicines to control the pain.  Constipation. You may be given a stool softener to help prevent this.  You will have some swelling and bruising in the scrotum and right inguinal area--this should not be extensive or tense HOME CARE INSTRUCTIONS  Only take over-the-counter or prescription medicines as directed by your health care provider.  Keep the incision area dry and clean. You may wash the incision area gently with soap and water 48 hours after surgery. Gently blot or dab the incision area dry. Do not take baths, use swimming pools, or use hot tubs for 10 days or until your health care provider approves.  Change bandages (dressings) as directed by your health care provider.  Continue your normal diet as directed by your health care provider. Eat plenty of fruits and vegetables to help prevent constipation.  Drink enough fluids to keep your urine clear or pale yellow. This also helps prevent constipation.  Do not drive until your health care provider says it is okay.  Do not lift anything heavier than 10 lb (4.5 kg) or play contact sports for 4 weeks or until your health care provider approves.  Follow up with your health care provider as directed. Ask your health care provider when to make an appointment to have your stitches (sutures) or staples removed. SEEK MEDICAL CARE  IF:  You have increased bleeding coming from the incision site.  You have blood in your stool.  You have increasing pain in the incision area.  You see redness or swelling in the incision area.  You have fluid (pus) coming from the incision.  You have a fever.  You notice a bad smell coming from the incision area or dressing. SEEK IMMEDIATE MEDICAL CARE IF:  You develop a rash.  You have chest pain or shortness of breath.  You feel lightheaded or feel faint.   This information is not intended to replace advice given to you by your health care provider. Make sure you discuss any questions you have with your health care provider.   Document Released: 11/08/2004 Document Revised: 05/12/2014 Document Reviewed: 12/01/2012 Elsevier Interactive Patient Education [employer].

## 2015-08-27 NOTE — Anesthesia Procedure Notes (Addendum)
Anesthesia Regional Block:  TAP block  Pre-Anesthetic Checklist: ,, timeout performed, Correct Patient, Correct Site, Correct Laterality, Correct Procedure, Correct Position, site marked, Risks and benefits discussed,  Surgical consent,  Pre-op evaluation,  At surgeon's request and post-op pain management  Laterality: Right  Prep: chloraprep       Needles:  Injection technique: Single-shot  Needle Type: Echogenic Needle     Needle Length: 9cm 9 cm Needle Gauge: 21 and 21 G    Additional Needles:  Procedures: ultrasound guided (picture in chart) TAP block Narrative:  Start time: 08/27/2015 7:08 AM End time: 08/27/2015 7:12 AM Injection made incrementally with aspirations every 5 mL.  Performed by: Personally  Anesthesiologist: Suella Broad D  Additional Notes: Pt tolerated well. No immediate complications noted.    Procedure Name: Intubation Date/Time: 08/27/2015 7:23 AM Performed by: Kyung Rudd Pre-anesthesia Checklist: Patient identified, Emergency Drugs available, Suction available, Patient being monitored and Timeout performed Patient Re-evaluated:Patient Re-evaluated prior to inductionOxygen Delivery Method: Circle system utilized Preoxygenation: Pre-oxygenation with 100% oxygen Intubation Type: IV induction Ventilation: Mask ventilation without difficulty Laryngoscope Size: Mac and 3 Grade View: Grade II Tube type: Oral Tube size: 7.5 mm Number of attempts: 1 Airway Equipment and Method: Stylet Placement Confirmation: ETT inserted through vocal cords under direct vision,  positive ETCO2 and breath sounds checked- equal and bilateral Secured at: 21 cm Tube secured with: Tape Dental Injury: Teeth and Oropharynx as per pre-operative assessment

## 2015-08-27 NOTE — Transfer of Care (Signed)
Immediate Anesthesia Transfer of Care Note  Patient: Austin Guerrero  Procedure(s) Performed: Procedure(s): LAPAROSCOPIC RIGHT  INGUINAL HERNIA REPAIR (Right) INSERTION OF MESH (Right)  Patient Location: PACU  Anesthesia Type:General and Regional  Level of Consciousness: awake, alert  and oriented  Airway & Oxygen Therapy: Patient Spontanous Breathing and Patient connected to nasal cannula oxygen  Post-op Assessment: Report given to RN, Post -op Vital signs reviewed and stable and Patient moving all extremities X 4  Post vital signs: Reviewed and stable  Last Vitals:  Filed Vitals:   08/27/15 0905 08/27/15 0907  BP:  133/71  Pulse:  72  Temp: 36.3 C   Resp:  16    Complications: No apparent anesthesia complications

## 2015-08-28 ENCOUNTER — Encounter (HOSPITAL_COMMUNITY): Payer: Self-pay | Admitting: General Surgery

## 2015-10-10 DIAGNOSIS — L570 Actinic keratosis: Secondary | ICD-10-CM | POA: Diagnosis not present

## 2015-10-10 DIAGNOSIS — D2272 Melanocytic nevi of left lower limb, including hip: Secondary | ICD-10-CM | POA: Diagnosis not present

## 2015-10-10 DIAGNOSIS — L821 Other seborrheic keratosis: Secondary | ICD-10-CM | POA: Diagnosis not present

## 2015-10-10 DIAGNOSIS — D1801 Hemangioma of skin and subcutaneous tissue: Secondary | ICD-10-CM | POA: Diagnosis not present

## 2015-10-10 DIAGNOSIS — D171 Benign lipomatous neoplasm of skin and subcutaneous tissue of trunk: Secondary | ICD-10-CM | POA: Diagnosis not present

## 2015-10-17 DIAGNOSIS — C61 Malignant neoplasm of prostate: Secondary | ICD-10-CM | POA: Diagnosis not present

## 2015-10-24 DIAGNOSIS — C61 Malignant neoplasm of prostate: Secondary | ICD-10-CM | POA: Diagnosis not present

## 2015-11-08 ENCOUNTER — Telehealth: Payer: Self-pay | Admitting: Medical Oncology

## 2015-11-08 NOTE — Telephone Encounter (Signed)
Oncology Nurse Navigator Documentation  Oncology Nurse Navigator Flowsheets 07/11/2015 07/17/2015 11/08/2015  Navigator Location - - -  Navigator Encounter Type Treatment Treatment Telephone;3 month- Austin Guerrero states he is doing very well. He has recovered from his radiation treatments. He has hernia surgery 4/24 and that went well also. He followed up with Dr. Alinda Money 6/14 and his PSA had decreased significantly and Dr. Alinda Money was very pleased.His  Next PSA and follow up will be in January. I asked him to call me if I can be of assistance to him. He voiced understanding.  Telephone - - Outgoing Call;Clinic/[facility] Follow-up  Abnormal Finding Date - - -  Confirmed Diagnosis Date - - -  Treatment Initiated Date - - -  Patient Visit Type - - -  Treatment Phase Treatment Final Radiation Tx Post-Tx Follow-up  Barriers/Navigation Needs No barriers at this time No barriers at this time No barriers at this time  Interventions - - None required  Support Groups/Services - - Friends and Family  Acuity - Level 1 Level 1  Acuity Level 1 - Minimal follow up required Minimal follow up required  Acuity Level 2 - - -  Time Spent with Patient 15 15 15

## 2015-11-18 DIAGNOSIS — A09 Infectious gastroenteritis and colitis, unspecified: Secondary | ICD-10-CM | POA: Diagnosis not present

## 2015-11-29 DIAGNOSIS — I10 Essential (primary) hypertension: Secondary | ICD-10-CM | POA: Diagnosis not present

## 2015-11-29 DIAGNOSIS — K219 Gastro-esophageal reflux disease without esophagitis: Secondary | ICD-10-CM | POA: Diagnosis not present

## 2015-11-29 DIAGNOSIS — E785 Hyperlipidemia, unspecified: Secondary | ICD-10-CM | POA: Diagnosis not present

## 2015-11-29 DIAGNOSIS — J309 Allergic rhinitis, unspecified: Secondary | ICD-10-CM | POA: Diagnosis not present

## 2015-11-29 DIAGNOSIS — C61 Malignant neoplasm of prostate: Secondary | ICD-10-CM | POA: Diagnosis not present

## 2015-11-29 DIAGNOSIS — H409 Unspecified glaucoma: Secondary | ICD-10-CM | POA: Diagnosis not present

## 2016-02-04 DIAGNOSIS — Z23 Encounter for immunization: Secondary | ICD-10-CM | POA: Diagnosis not present

## 2016-04-10 DIAGNOSIS — D171 Benign lipomatous neoplasm of skin and subcutaneous tissue of trunk: Secondary | ICD-10-CM | POA: Diagnosis not present

## 2016-04-10 DIAGNOSIS — L570 Actinic keratosis: Secondary | ICD-10-CM | POA: Diagnosis not present

## 2016-04-10 DIAGNOSIS — L821 Other seborrheic keratosis: Secondary | ICD-10-CM | POA: Diagnosis not present

## 2016-04-10 DIAGNOSIS — D2272 Melanocytic nevi of left lower limb, including hip: Secondary | ICD-10-CM | POA: Diagnosis not present

## 2016-04-10 DIAGNOSIS — D1801 Hemangioma of skin and subcutaneous tissue: Secondary | ICD-10-CM | POA: Diagnosis not present

## 2016-05-12 DIAGNOSIS — R05 Cough: Secondary | ICD-10-CM | POA: Diagnosis not present

## 2016-05-12 DIAGNOSIS — J101 Influenza due to other identified influenza virus with other respiratory manifestations: Secondary | ICD-10-CM | POA: Diagnosis not present

## 2016-05-19 DIAGNOSIS — C61 Malignant neoplasm of prostate: Secondary | ICD-10-CM | POA: Diagnosis not present

## 2016-05-23 DIAGNOSIS — N5201 Erectile dysfunction due to arterial insufficiency: Secondary | ICD-10-CM | POA: Diagnosis not present

## 2016-05-23 DIAGNOSIS — C61 Malignant neoplasm of prostate: Secondary | ICD-10-CM | POA: Diagnosis not present

## 2016-06-04 DIAGNOSIS — H401131 Primary open-angle glaucoma, bilateral, mild stage: Secondary | ICD-10-CM | POA: Diagnosis not present

## 2016-06-09 DIAGNOSIS — Z79899 Other long term (current) drug therapy: Secondary | ICD-10-CM | POA: Diagnosis not present

## 2016-06-09 DIAGNOSIS — E785 Hyperlipidemia, unspecified: Secondary | ICD-10-CM | POA: Diagnosis not present

## 2016-06-09 DIAGNOSIS — Z0000 Encounter for general adult medical examination without abnormal findings: Secondary | ICD-10-CM | POA: Diagnosis not present

## 2016-06-09 DIAGNOSIS — I10 Essential (primary) hypertension: Secondary | ICD-10-CM | POA: Diagnosis not present

## 2016-06-09 DIAGNOSIS — K219 Gastro-esophageal reflux disease without esophagitis: Secondary | ICD-10-CM | POA: Diagnosis not present

## 2016-06-16 DIAGNOSIS — Z7189 Other specified counseling: Secondary | ICD-10-CM | POA: Diagnosis not present

## 2016-06-16 DIAGNOSIS — Z86010 Personal history of colonic polyps: Secondary | ICD-10-CM | POA: Diagnosis not present

## 2016-06-16 DIAGNOSIS — Z1159 Encounter for screening for other viral diseases: Secondary | ICD-10-CM | POA: Diagnosis not present

## 2016-06-16 DIAGNOSIS — I10 Essential (primary) hypertension: Secondary | ICD-10-CM | POA: Diagnosis not present

## 2016-06-16 DIAGNOSIS — C61 Malignant neoplasm of prostate: Secondary | ICD-10-CM | POA: Diagnosis not present

## 2016-06-16 DIAGNOSIS — Z1389 Encounter for screening for other disorder: Secondary | ICD-10-CM | POA: Diagnosis not present

## 2016-06-16 DIAGNOSIS — Z0000 Encounter for general adult medical examination without abnormal findings: Secondary | ICD-10-CM | POA: Diagnosis not present

## 2016-06-16 DIAGNOSIS — J309 Allergic rhinitis, unspecified: Secondary | ICD-10-CM | POA: Diagnosis not present

## 2016-06-16 DIAGNOSIS — K219 Gastro-esophageal reflux disease without esophagitis: Secondary | ICD-10-CM | POA: Diagnosis not present

## 2016-06-16 DIAGNOSIS — H409 Unspecified glaucoma: Secondary | ICD-10-CM | POA: Diagnosis not present

## 2016-06-16 DIAGNOSIS — E785 Hyperlipidemia, unspecified: Secondary | ICD-10-CM | POA: Diagnosis not present

## 2016-07-11 DIAGNOSIS — K5730 Diverticulosis of large intestine without perforation or abscess without bleeding: Secondary | ICD-10-CM | POA: Diagnosis not present

## 2016-07-11 DIAGNOSIS — R05 Cough: Secondary | ICD-10-CM | POA: Diagnosis not present

## 2016-07-11 DIAGNOSIS — Z86010 Personal history of colonic polyps: Secondary | ICD-10-CM | POA: Diagnosis not present

## 2016-08-04 DIAGNOSIS — K2930 Chronic superficial gastritis without bleeding: Secondary | ICD-10-CM | POA: Diagnosis not present

## 2016-08-04 DIAGNOSIS — D132 Benign neoplasm of duodenum: Secondary | ICD-10-CM | POA: Diagnosis not present

## 2016-08-04 DIAGNOSIS — R05 Cough: Secondary | ICD-10-CM | POA: Diagnosis not present

## 2016-08-04 DIAGNOSIS — K3189 Other diseases of stomach and duodenum: Secondary | ICD-10-CM | POA: Diagnosis not present

## 2016-08-07 DIAGNOSIS — K3189 Other diseases of stomach and duodenum: Secondary | ICD-10-CM | POA: Diagnosis not present

## 2016-08-07 DIAGNOSIS — D132 Benign neoplasm of duodenum: Secondary | ICD-10-CM | POA: Diagnosis not present

## 2016-10-09 DIAGNOSIS — R05 Cough: Secondary | ICD-10-CM | POA: Diagnosis not present

## 2016-10-09 DIAGNOSIS — D132 Benign neoplasm of duodenum: Secondary | ICD-10-CM | POA: Diagnosis not present

## 2016-10-09 DIAGNOSIS — Z86010 Personal history of colonic polyps: Secondary | ICD-10-CM | POA: Diagnosis not present

## 2016-10-14 DIAGNOSIS — D225 Melanocytic nevi of trunk: Secondary | ICD-10-CM | POA: Diagnosis not present

## 2016-10-14 DIAGNOSIS — L821 Other seborrheic keratosis: Secondary | ICD-10-CM | POA: Diagnosis not present

## 2016-10-14 DIAGNOSIS — D1801 Hemangioma of skin and subcutaneous tissue: Secondary | ICD-10-CM | POA: Diagnosis not present

## 2016-10-14 DIAGNOSIS — L570 Actinic keratosis: Secondary | ICD-10-CM | POA: Diagnosis not present

## 2016-11-17 DIAGNOSIS — C61 Malignant neoplasm of prostate: Secondary | ICD-10-CM | POA: Diagnosis not present

## 2016-11-28 DIAGNOSIS — C61 Malignant neoplasm of prostate: Secondary | ICD-10-CM | POA: Diagnosis not present

## 2016-11-28 DIAGNOSIS — N5201 Erectile dysfunction due to arterial insufficiency: Secondary | ICD-10-CM | POA: Diagnosis not present

## 2016-12-08 IMAGING — MR MR PROSTATE WO/W CM
23 of 53 series · 23 of 53 positions shown · IV contrast (yes)
Comparison: The biopsy result dated 11/25/2010.

CLINICAL DATA: Biopsy-proven prostate cancer. On active
surveillance. Scheduled for repeat biopsy.

EXAM:
MR PROSTATE WITHOUT AND WITH CONTRAST
TECHNIQUE: Multiplanar multisequence MRI images were obtained of the pelvis
centered about the prostate. Pre and post contrast images were
obtained.
CONTRAST:  15mL MULTIHANCE GADOBENATE DIMEGLUMINE 529 MG/ML IV SOLN

[Series 3: bSSFP fat-sat · axial · 6.0mm · 0.86mm/px · 1 of 43 slices shown]
[im 1/43]
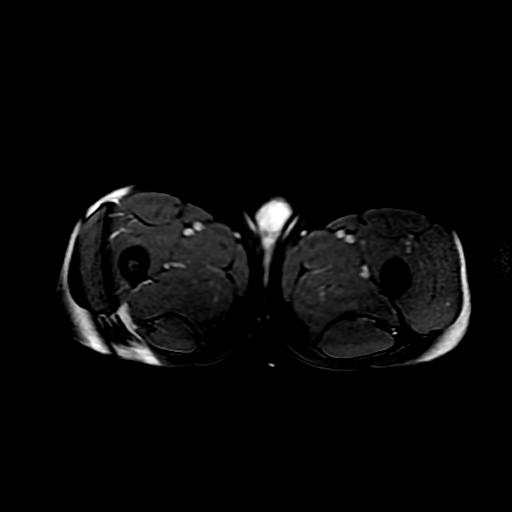

[Series 4: T1 · axial · 8.0mm · 0.70mm/px · 1 of 30 slices shown (1 of 2)]
[im 1/30]
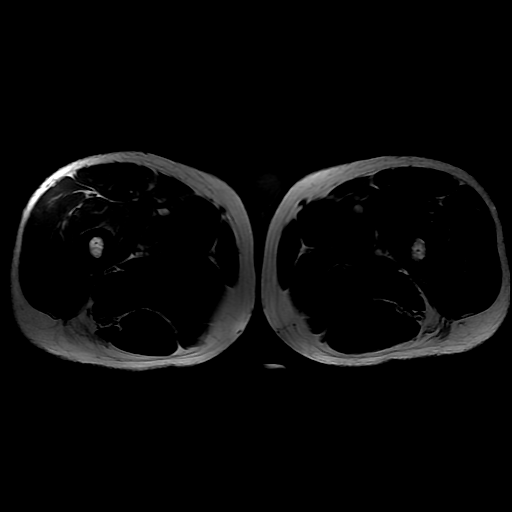

[Series 5: T2 · axial · 3.0mm · 0.29mm/px · 1 of 27 slices shown (1 of 3)]
[im 1/27]
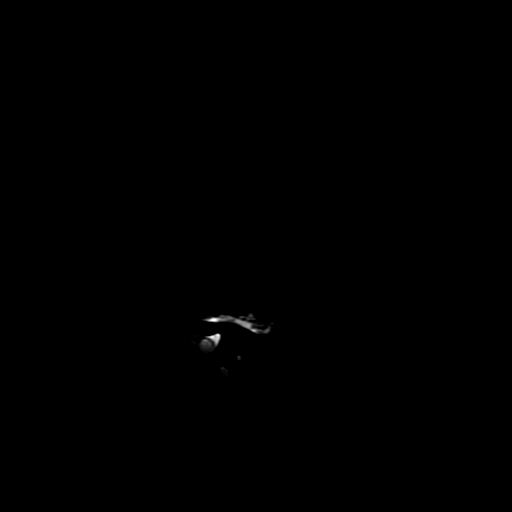

[Series 6: T1 · axial · 3.0mm · 0.29mm/px · 1 of 27 slices shown (2 of 2)]
[im 1/27]
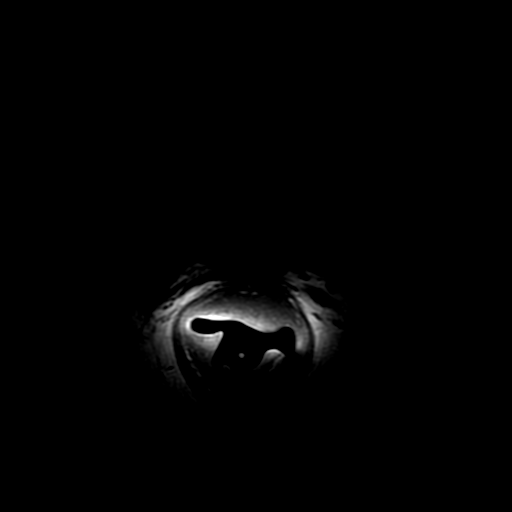

[Series 7: T2 · sagittal · 4.0mm · 0.29mm/px · 1 of 23 slices shown (2 of 3)]
[im 1/23]
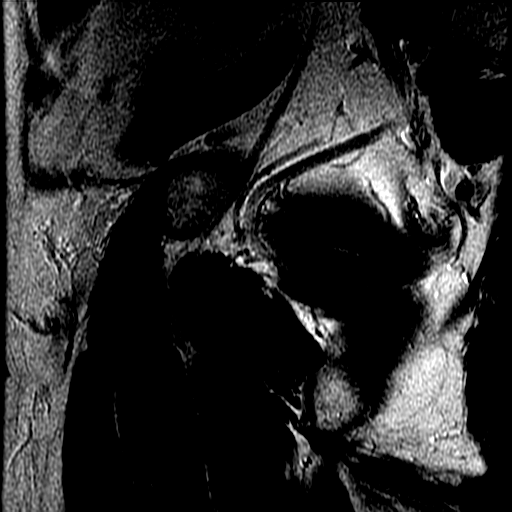

[Series 8: T2 · coronal · 4.0mm · 0.29mm/px · 1 of 19 slices shown (3 of 3)]
[im 1/19]
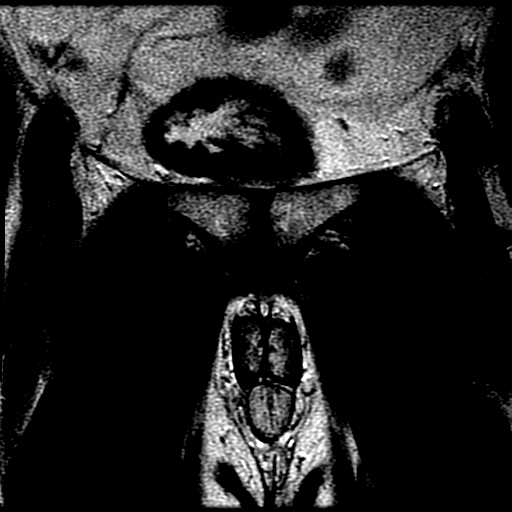

[Series 9: DWI · axial · 3.0mm · 0.59mm/px · 1 of 54 slices shown (1 of 6)]
[im 1/54]
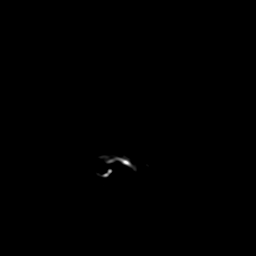

[Series 10: DWI · axial · 3.0mm · 0.59mm/px · 1 of 53 slices shown (2 of 6)]
[im 1/53]
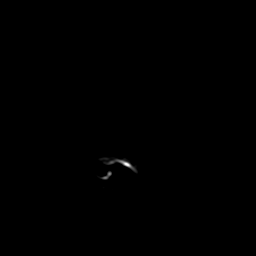

[Series 11: DWI · axial · 3.0mm · 0.59mm/px · 1 of 50 slices shown (3 of 6)]
[im 1/50]
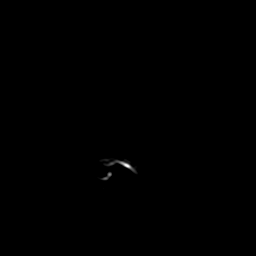

[Series 900: DWI · axial · 3.0mm · 0.59mm/px · 1 of 27 slices shown (4 of 6)]
[im 1/27]
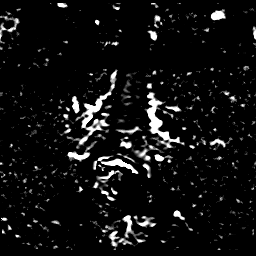

[Series 1000: DWI · axial · 3.0mm · 0.59mm/px · 1 of 27 slices shown (5 of 6)]
[im 1/27]
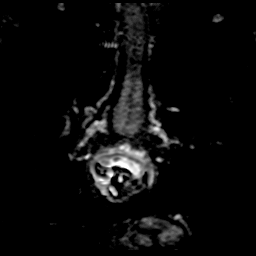

[Series 1100: DWI · axial · 3.0mm · 0.59mm/px · 1 of 27 slices shown (6 of 6)]
[im 1/27]
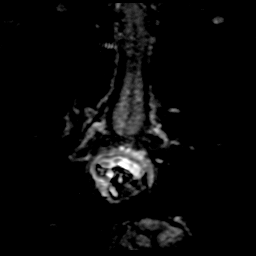

[((id)/(id)/1)-((id)/(id)/1) · axial · 3.0mm · 0.43mm/px · 1 of 71 slices shown (1 of 11)]
[im 1/71]
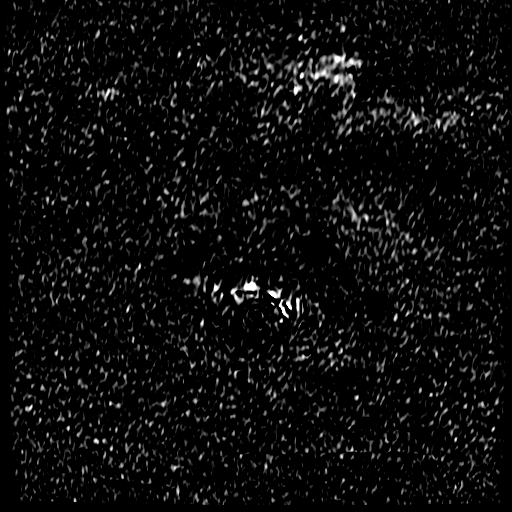

[((id)/(id)/1)-((id)/(id)/1) · axial · 3.0mm · 0.43mm/px · 1 of 71 slices shown (2 of 11)]
[im 1/71]
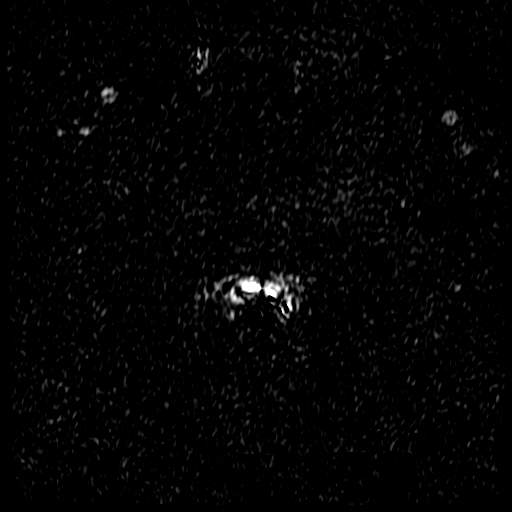

[((id)/(id)/1)-((id)/(id)/1) · axial · 3.0mm · 0.43mm/px · 1 of 72 slices shown (3 of 11)]
[im 1/72]
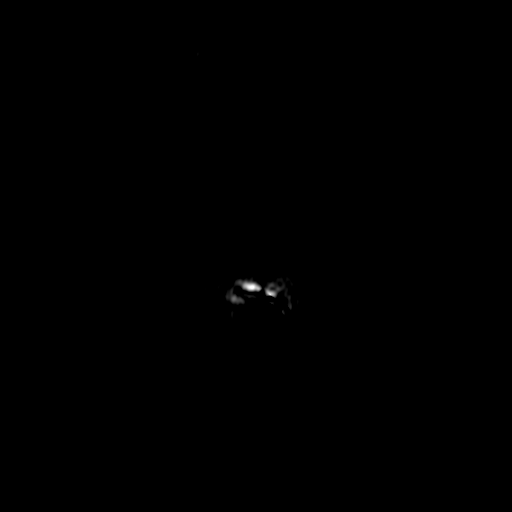

[((id)/(id)/1)-((id)/(id)/1) · axial · 3.0mm · 0.43mm/px · 1 of 71 slices shown (4 of 11)]
[im 1/71]
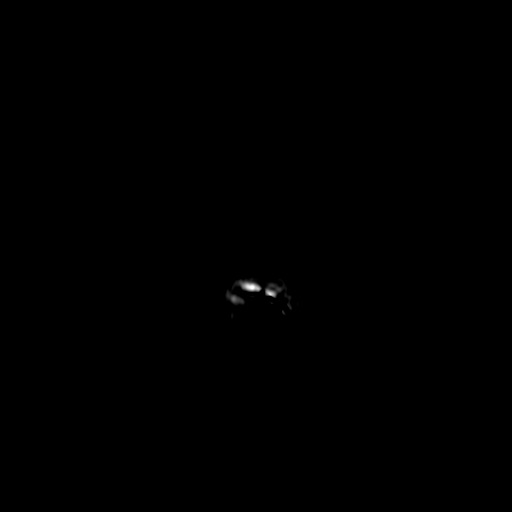

[((id)/(id)/1)-((id)/(id)/1) · axial · 3.0mm · 0.43mm/px · 1 of 72 slices shown (5 of 11)]
[im 1/72]
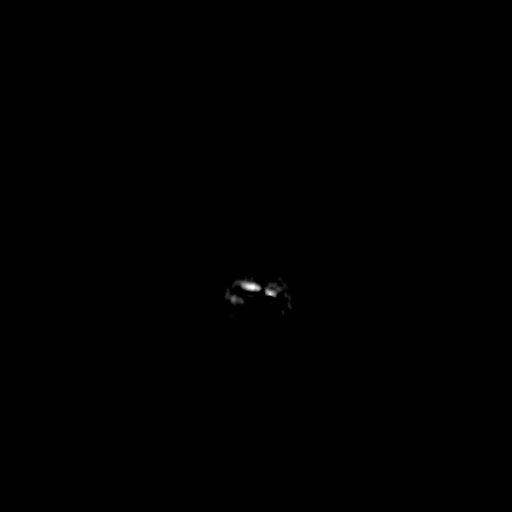

[((id)/(id)/1)-((id)/(id)/1) · axial · 3.0mm · 0.43mm/px · 1 of 72 slices shown (6 of 11)]
[im 1/72]
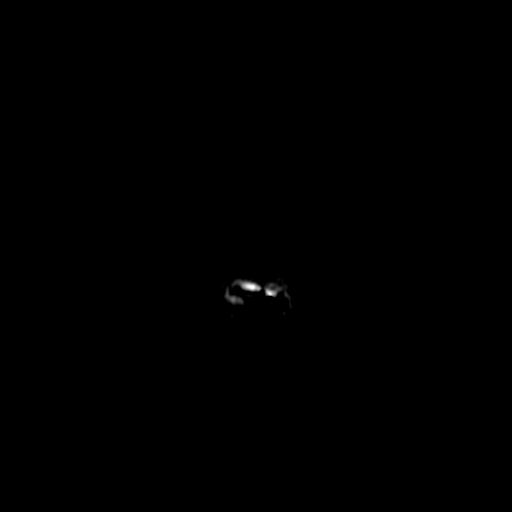

[((id)/(id)/1)-((id)/(id)/1) · axial · 3.0mm · 0.43mm/px · 1 of 71 slices shown (7 of 11)]
[im 1/71]
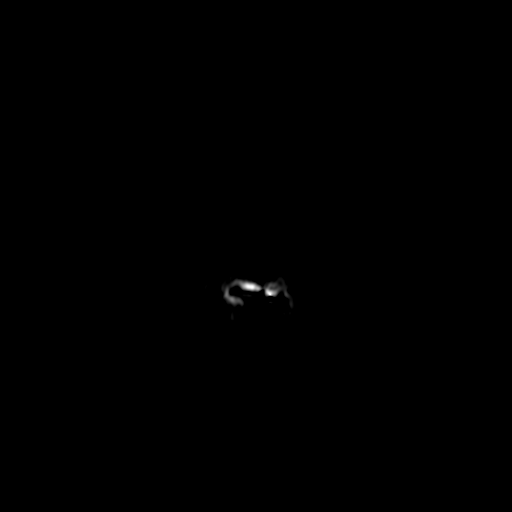

[((id)/(id)/1)-((id)/(id)/1) · axial · 3.0mm · 0.43mm/px · 1 of 72 slices shown (8 of 11)]
[im 1/72]
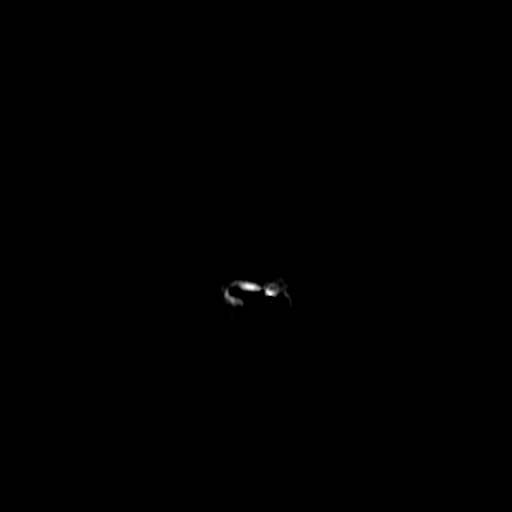

[((id)/(id)/1)-((id)/(id)/1) · axial · 3.0mm · 0.43mm/px · 1 of 72 slices shown (9 of 11)]
[im 1/72]
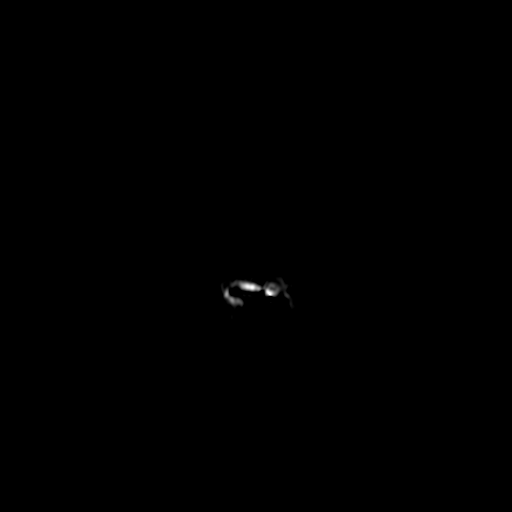

[((id)/(id)/1)-((id)/(id)/1) · axial · 3.0mm · 0.43mm/px · 1 of 71 slices shown (10 of 11)]
[im 1/71]
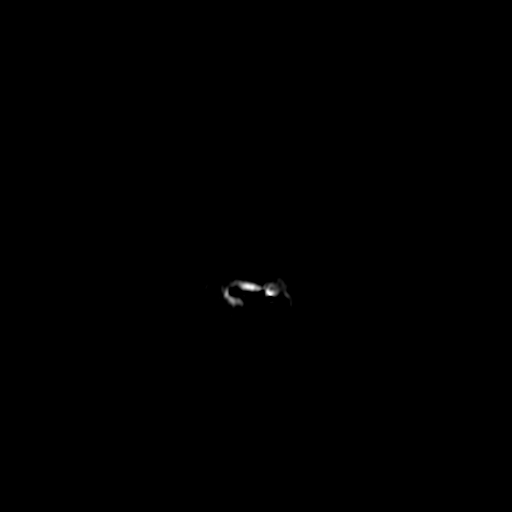

[((id)/(id)/1)-((id)/(id)/1) · axial · 3.0mm · 0.43mm/px · 1 of 71 slices shown (11 of 11)]
[im 1/71]
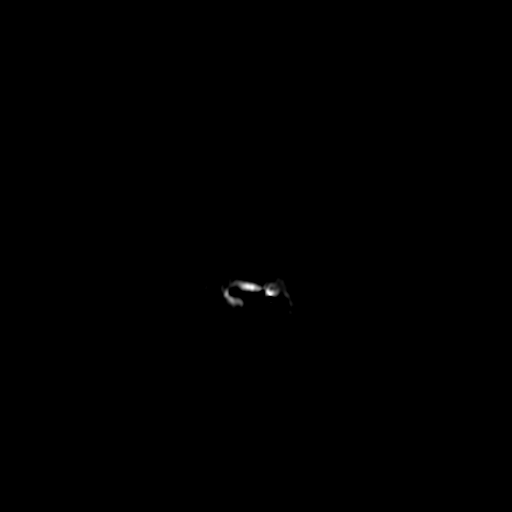

[23 of 53 positions shown; findings below may reference images not displayed]

FINDINGS: Prostate: Relatively mild central gland enlargement and
heterogeneity, consistent with benign prostatic hyperplasia.

Diffuse heterogeneous T2 signal throughout the peripheral zone,
nonspecific.

More focal area of masslike T2 hypo intensity within the anterior
aspect of the left apex. This measures 9 x 5 mm on image/series
May 22, 24 mm craniocaudal on image/series [DATE]. Vaguely apparent on
[DATE]. This corresponds to restricted diffusion on image/series
[DATE] and 10/5533. Early post-contrast enhancement, most apparent
on image/series [DATE].

Transcapsular spread: No gross trans capsular spread identified. The
lesion likely contacts the capsule throughout nearly 10 mm,
including on image/series [DATE].

Seminal vesicle involvement: Absent

Neurovascular bundle involvement: Absent

Pelvic adenopathy: Absent

Bone metastasis: Absent

Other findings: Sigmoid diverticulosis. Normal urinary bladder. No
significant free fluid.
IMPRESSION: 1. Suspicious (PI-RADS 4) finding within the anterior left apex.
Presence of restricted diffusion and early post-contrast enhancement
suggest relatively high-grade disease. Focused sampling of this area
is suggested.
2. Although no gross trans capsular spread is seen, contact of the
capsule for close to 1 cm may predispose to small volume trans
capsular spread.
3.  No evidence of locally advanced or pelvic metastatic disease.

## 2016-12-30 DIAGNOSIS — Z23 Encounter for immunization: Secondary | ICD-10-CM | POA: Diagnosis not present

## 2017-01-12 DIAGNOSIS — H35373 Puckering of macula, bilateral: Secondary | ICD-10-CM | POA: Diagnosis not present

## 2017-01-12 DIAGNOSIS — H4089 Other specified glaucoma: Secondary | ICD-10-CM | POA: Diagnosis not present

## 2017-01-12 DIAGNOSIS — H2513 Age-related nuclear cataract, bilateral: Secondary | ICD-10-CM | POA: Diagnosis not present

## 2017-02-12 DIAGNOSIS — H4089 Other specified glaucoma: Secondary | ICD-10-CM | POA: Diagnosis not present

## 2017-04-03 DIAGNOSIS — H2513 Age-related nuclear cataract, bilateral: Secondary | ICD-10-CM | POA: Diagnosis not present

## 2017-04-03 DIAGNOSIS — H4089 Other specified glaucoma: Secondary | ICD-10-CM | POA: Diagnosis not present

## 2017-04-08 DIAGNOSIS — D132 Benign neoplasm of duodenum: Secondary | ICD-10-CM | POA: Diagnosis not present

## 2017-04-08 DIAGNOSIS — K625 Hemorrhage of anus and rectum: Secondary | ICD-10-CM | POA: Diagnosis not present

## 2017-04-08 DIAGNOSIS — Z86010 Personal history of colonic polyps: Secondary | ICD-10-CM | POA: Diagnosis not present

## 2017-04-10 ENCOUNTER — Other Ambulatory Visit: Payer: Self-pay | Admitting: Gastroenterology

## 2017-04-10 DIAGNOSIS — K2970 Gastritis, unspecified, without bleeding: Secondary | ICD-10-CM | POA: Diagnosis not present

## 2017-04-10 DIAGNOSIS — K648 Other hemorrhoids: Secondary | ICD-10-CM | POA: Diagnosis not present

## 2017-04-10 DIAGNOSIS — K644 Residual hemorrhoidal skin tags: Secondary | ICD-10-CM | POA: Diagnosis not present

## 2017-04-10 DIAGNOSIS — K5730 Diverticulosis of large intestine without perforation or abscess without bleeding: Secondary | ICD-10-CM | POA: Diagnosis not present

## 2017-04-10 DIAGNOSIS — D132 Benign neoplasm of duodenum: Secondary | ICD-10-CM | POA: Diagnosis not present

## 2017-04-10 DIAGNOSIS — D126 Benign neoplasm of colon, unspecified: Secondary | ICD-10-CM | POA: Diagnosis not present

## 2017-04-10 DIAGNOSIS — K625 Hemorrhage of anus and rectum: Secondary | ICD-10-CM | POA: Diagnosis not present

## 2017-04-10 DIAGNOSIS — K317 Polyp of stomach and duodenum: Secondary | ICD-10-CM | POA: Diagnosis not present

## 2017-04-17 ENCOUNTER — Other Ambulatory Visit: Payer: Self-pay | Admitting: Gastroenterology

## 2017-04-20 DIAGNOSIS — L814 Other melanin hyperpigmentation: Secondary | ICD-10-CM | POA: Diagnosis not present

## 2017-04-20 DIAGNOSIS — L570 Actinic keratosis: Secondary | ICD-10-CM | POA: Diagnosis not present

## 2017-04-20 DIAGNOSIS — L821 Other seborrheic keratosis: Secondary | ICD-10-CM | POA: Diagnosis not present

## 2017-04-21 ENCOUNTER — Encounter (HOSPITAL_COMMUNITY)
Admission: RE | Disposition: A | Discharge status: Home / Self Care | Payer: Self-pay | Source: Ambulatory Visit | Attending: Gastroenterology

## 2017-04-21 ENCOUNTER — Ambulatory Visit (HOSPITAL_COMMUNITY): Payer: Medicare Other | Admitting: Anesthesiology

## 2017-04-21 ENCOUNTER — Ambulatory Visit (HOSPITAL_COMMUNITY)
Admission: RE | Admit: 2017-04-21 | Discharge: 2017-04-21 | Disposition: A | Discharge status: Home / Self Care | Payer: Medicare Other | Source: Ambulatory Visit | Attending: Gastroenterology | Admitting: Gastroenterology

## 2017-04-21 ENCOUNTER — Other Ambulatory Visit: Payer: Self-pay

## 2017-04-21 ENCOUNTER — Encounter (HOSPITAL_COMMUNITY): Payer: Self-pay | Admitting: *Deleted

## 2017-04-21 DIAGNOSIS — K921 Melena: Secondary | ICD-10-CM | POA: Diagnosis present

## 2017-04-21 DIAGNOSIS — E785 Hyperlipidemia, unspecified: Secondary | ICD-10-CM | POA: Diagnosis not present

## 2017-04-21 DIAGNOSIS — Z79899 Other long term (current) drug therapy: Secondary | ICD-10-CM | POA: Insufficient documentation

## 2017-04-21 DIAGNOSIS — Z8042 Family history of malignant neoplasm of prostate: Secondary | ICD-10-CM | POA: Diagnosis not present

## 2017-04-21 DIAGNOSIS — K5730 Diverticulosis of large intestine without perforation or abscess without bleeding: Secondary | ICD-10-CM | POA: Diagnosis not present

## 2017-04-21 DIAGNOSIS — C61 Malignant neoplasm of prostate: Secondary | ICD-10-CM | POA: Diagnosis not present

## 2017-04-21 DIAGNOSIS — Y842 Radiological procedure and radiotherapy as the cause of abnormal reaction of the patient, or of later complication, without mention of misadventure at the time of the procedure: Secondary | ICD-10-CM | POA: Insufficient documentation

## 2017-04-21 DIAGNOSIS — I10 Essential (primary) hypertension: Secondary | ICD-10-CM | POA: Insufficient documentation

## 2017-04-21 DIAGNOSIS — Z7982 Long term (current) use of aspirin: Secondary | ICD-10-CM | POA: Insufficient documentation

## 2017-04-21 DIAGNOSIS — K219 Gastro-esophageal reflux disease without esophagitis: Secondary | ICD-10-CM | POA: Insufficient documentation

## 2017-04-21 DIAGNOSIS — K627 Radiation proctitis: Secondary | ICD-10-CM | POA: Insufficient documentation

## 2017-04-21 HISTORY — PX: FLEXIBLE SIGMOIDOSCOPY: SHX5431

## 2017-04-21 SURGERY — SIGMOIDOSCOPY, FLEXIBLE
Anesthesia: Monitor Anesthesia Care

## 2017-04-21 MED ORDER — PROPOFOL 10 MG/ML IV BOLUS
INTRAVENOUS | Status: AC
  Filled 2017-04-21: qty 60

## 2017-04-21 MED ORDER — PROPOFOL 10 MG/ML IV BOLUS
INTRAVENOUS | Status: DC | PRN
  Administered 2017-04-21: 70 mg via INTRAVENOUS

## 2017-04-21 MED ORDER — SODIUM CHLORIDE 0.9 % IV SOLN: INTRAVENOUS | Status: DC

## 2017-04-21 MED ORDER — LACTATED RINGERS IV SOLN
INTRAVENOUS | Status: DC
  Administered 2017-04-21: 13:00:00 via INTRAVENOUS

## 2017-04-21 MED ORDER — PROPOFOL 500 MG/50ML IV EMUL
INTRAVENOUS | Status: DC | PRN
  Administered 2017-04-21: 125 ug/kg/min via INTRAVENOUS

## 2017-04-21 NOTE — Transfer of Care (Signed)
Immediate Anesthesia Transfer of Care Note  Patient: Austin Guerrero  Procedure(s) Performed: FLEXIBLE SIGMOIDOSCOPY (N/A ) GI RADIOFREQUENCY ABLATION (N/A )  Patient Location: PACU and Endoscopy Unit  Anesthesia Type:MAC  Level of Consciousness: sedated  Airway & Oxygen Therapy: Patient Spontanous Breathing and Patient connected to face mask oxygen  Post-op Assessment: Report given to RN and Post -op Vital signs reviewed and stable  Post vital signs: Reviewed and stable  Last Vitals:  Vitals:   04/21/17 1255  BP: (!) 181/102  Pulse: 79  Resp: 16  Temp: 36.9 C  SpO2: 98%    Last Pain:  Vitals:   04/21/17 1255  TempSrc: Oral         Complications: No apparent anesthesia complications

## 2017-04-21 NOTE — Discharge Instructions (Signed)
Call if question or problem otherwise follow-up in one month   YOU HAD AN ENDOSCOPIC PROCEDURE TODAY: Refer to the procedure report and other information in the discharge instructions given to you for any specific questions about what was found during the examination. If this information does not answer your questions, please call [facility] office at [phone] to clarify.   YOU SHOULD EXPECT: Some feelings of bloating in the abdomen. Passage of more gas than usual. Walking can help get rid of the air that was put into your GI tract during the procedure and reduce the bloating. If you had a lower endoscopy (such as a colonoscopy or flexible sigmoidoscopy) you may notice spotting of blood in your stool or on the toilet paper. Some abdominal soreness may be present for a day or two, also.  DIET: Your first meal following the procedure should be a light meal and then it is ok to progress to your normal diet. A half-sandwich or bowl of soup is an example of a good first meal. Heavy or fried foods are harder to digest and may make you feel nauseous or bloated. Drink plenty of fluids but you should avoid alcoholic beverages for 24 hours. If you had a esophageal dilation, please see attached instructions for diet.   ACTIVITY: Your care partner should take you home directly after the procedure. You should plan to take it easy, moving slowly for the rest of the day. You can resume normal activity the day after the procedure however YOU SHOULD NOT DRIVE, use power tools, machinery or perform tasks that involve climbing or major physical exertion for 24 hours (because of the sedation medicines used during the test).   SYMPTOMS TO REPORT IMMEDIATELY: A gastroenterologist can be reached at any hour. Please call [phone]  for any of the following symptoms:  Following lower endoscopy (colonoscopy, flexible sigmoidoscopy) Excessive amounts of blood in the stool  Significant tenderness, worsening of abdominal  pains  Swelling of the abdomen that is new, acute  Fever of 100 or higher  Following upper endoscopy (EGD, EUS, ERCP, esophageal dilation) Vomiting of blood or coffee ground material  New, significant abdominal pain  New, significant chest pain or pain under the shoulder blades  Painful or persistently difficult swallowing  New shortness of breath  Black, tarry-looking or red, bloody stools  FOLLOW UP:  If any biopsies were taken you will be contacted by phone or by letter within the next 1-3 weeks. Call [phone]  if you have not heard about the biopsies in 3 weeks.  Please also call with any specific questions about appointments or follow up tests.

## 2017-04-21 NOTE — Progress Notes (Signed)
Bing Sansone 1:39 PM  Subjective: Patient without any new complaints since he was seen recently in the office by my partner and his case discussed with his wife as well and his previous colonoscopy reviewed and we discussed the procedure as well  Objective: Vital signs stable afebrile no acute distress exam please see preassessment evaluation  Assessment: Radiation proctitis  Plan: We discussed the procedure including the risks and the warnings and will proceed with anesthesia assistance with further workup and plans pending those findings  [facility]  Pager [phone] After 5PM or if no answer call [phone]

## 2017-04-21 NOTE — Anesthesia Preprocedure Evaluation (Signed)
Anesthesia Evaluation  Patient identified by MRN, date of birth, ID band Patient awake    Reviewed: Allergy & Precautions, NPO status , Patient's Chart, lab work & pertinent test results  Airway Mallampati: I  TM Distance: >3 FB Neck ROM: Full    Dental   Pulmonary    Pulmonary exam normal        Cardiovascular hypertension, Pt. on medications Normal cardiovascular exam     Neuro/Psych    GI/Hepatic GERD  Medicated and Controlled,  Endo/Other    Renal/GU      Musculoskeletal   Abdominal   Peds  Hematology   Anesthesia Other Findings   Reproductive/Obstetrics                             Anesthesia Physical Anesthesia Plan  ASA: II  Anesthesia Plan: General   Post-op Pain Management:    Induction: Intravenous  PONV Risk Score and Plan: 2 and Treatment may vary due to age or medical condition and Ondansetron  Airway Management Planned: Simple Face Mask  Additional Equipment:   Intra-op Plan:   Post-operative Plan:   Informed Consent: I have reviewed the patients History and Physical, chart, labs and discussed the procedure including the risks, benefits and alternatives for the proposed anesthesia with the patient or authorized representative who has indicated his/her understanding and acceptance.     Plan Discussed with: CRNA and Surgeon  Anesthesia Plan Comments:         Anesthesia Quick Evaluation

## 2017-04-21 NOTE — Op Note (Signed)
[facility] Patient Name: Austin Guerrero Procedure Date: 04/21/2017 MRN: [id number] Attending MD: Clarene Essex , MD Date of Birth: 1946-02-27 CSN: [id number] Age: 71 Admit Type: Outpatient Procedure:                Flexible Sigmoidoscopy Indications:              Hematochezia Providers:                Clarene Essex, MD, Laverta Baltimore RN, RN, Alan Mulder, Technician Referring MD:              Medicines:                Propofol total dose 144 mg IV Complications:            No immediate complications. Estimated Blood Loss:     Estimated blood loss: none. Procedure:                Pre-Anesthesia Assessment:                           - Prior to the procedure, a History and Physical                            was performed, and patient medications and                            allergies were reviewed. The patient's tolerance of                            previous anesthesia was also reviewed. The risks                            and benefits of the procedure and the sedation                            options and risks were discussed with the patient.                            All questions were answered, and informed consent                            was obtained. Prior Anticoagulants: The patient has                            taken aspirin, last dose was 2 days prior to                            procedure. ASA Grade Assessment: II - A patient                            with mild systemic disease. After reviewing the  risks and benefits, the patient was deemed in                            satisfactory condition to undergo the procedure.                           After obtaining informed consent, the scope was                            passed under direct vision. The EG-[id number] ([id number])                            scope was introduced through the anus and advanced                            to the the sigmoid colon.  The flexible                            sigmoidoscopy was accomplished without difficulty.                            The patient tolerated the procedure well. The                            quality of the bowel preparation was adequate. Scope In: Scope Out: Findings:      The mucosa vascular pattern in the rectum was locally increased. Focal       radiofrequency ablation of radiation proctitis in the rectum was       performed. With the endoscope in place, the position and extent of the       abnormal mucosa and appropriate anatomic landmarks were noted. The       abnormal mucosa was irrigated with water. Lavage was performed. The       endoscope was then removed from the patient. The Barrx-60 radiofrequency       ablation catheter was attached to the tip of the endoscope. The       endoscope with the attached radiofrequency ablation catheter was then       passed under direct vision and advanced to the areas of abnormal mucosa.       The radiofrequency ablation catheter was placed in contact with the       surface of the abnormal mucosa under direct visualization and energy was       applied once at 12 J/cm2. Ablation was repeated in a likewise fashion to       all visible abnormal mucosa. The areas where abnormal mucosa had been       ablated were examined. There was a small amount of unablated abnormal       mucosa present.      The exam was otherwise without abnormality.      A few small-mouthed diverticula were found in the distal sigmoid colon.      The exam was otherwise without abnormality. Impression:               - Increased mucosa vascular pattern in the rectum.  Treated with radiofrequency ablation.                           - The examination was otherwise normal.                           - Diverticulosis in the distal sigmoid colon.                           - The examination was otherwise normal.                           - No specimens  collected. Moderate Sedation:      N/A- Per Anesthesia Care Recommendation:           - Soft diet today.                           - Continue present medications.                           - Return to GI clinic in 1 month.                           - Telephone GI clinic if symptomatic PRN. Procedure Code(s):        --- Professional ---                           [phone], Sigmoidoscopy, flexible; with ablation of                            tumor(s), polyp(s), or other lesion(s) (includes                            pre- and post-dilation and guide wire passage, when                            performed) Diagnosis Code(s):        --- Professional ---                           K92.1, Melena (includes Hematochezia)                           K57.30, Diverticulosis of large intestine without                            perforation or abscess without bleeding CPT copyright 2016 American Medical Association. All rights reserved. The codes documented in this report are preliminary and upon coder review may  be revised to meet current compliance requirements. Clarene Essex, MD 04/21/2017 2:19:43 PM This report has been signed electronically. Number of Addenda: 0

## 2017-04-21 NOTE — Anesthesia Postprocedure Evaluation (Signed)
Anesthesia Post Note  Patient: [name]  Procedure(s) Performed: FLEXIBLE SIGMOIDOSCOPY (N/A ) GI RADIOFREQUENCY ABLATION (N/A )     Patient location during evaluation: PACU Anesthesia Type: General Level of consciousness: awake and alert Pain management: pain level controlled Vital Signs Assessment: post-procedure vital signs reviewed and stable Respiratory status: spontaneous breathing, nonlabored ventilation, respiratory function stable and patient connected to nasal cannula oxygen Cardiovascular status: stable and blood pressure returned to baseline Postop Assessment: no apparent nausea or vomiting Anesthetic complications: no    Last Vitals:  Vitals:   04/21/17 1255 04/21/17 1421  BP: (!) 181/102 108/72  Pulse: 79 64  Resp: 16 (!) 9  Temp: 36.9 C (!) 36.4 C  SpO2: 98% 99%    Last Pain:  Vitals:   04/21/17 1421  TempSrc: Oral                 Jerrid Forgette DAVID

## 2017-04-22 ENCOUNTER — Encounter (HOSPITAL_COMMUNITY): Payer: Self-pay | Admitting: Gastroenterology

## 2017-04-22 DIAGNOSIS — D126 Benign neoplasm of colon, unspecified: Secondary | ICD-10-CM | POA: Diagnosis not present

## 2017-04-30 DIAGNOSIS — R197 Diarrhea, unspecified: Secondary | ICD-10-CM | POA: Diagnosis not present

## 2017-05-20 DIAGNOSIS — K921 Melena: Secondary | ICD-10-CM | POA: Diagnosis not present

## 2017-05-20 DIAGNOSIS — R198 Other specified symptoms and signs involving the digestive system and abdomen: Secondary | ICD-10-CM | POA: Diagnosis not present

## 2017-05-28 DIAGNOSIS — H4089 Other specified glaucoma: Secondary | ICD-10-CM | POA: Diagnosis not present

## 2017-06-10 DIAGNOSIS — C61 Malignant neoplasm of prostate: Secondary | ICD-10-CM | POA: Diagnosis not present

## 2017-06-17 DIAGNOSIS — C61 Malignant neoplasm of prostate: Secondary | ICD-10-CM | POA: Diagnosis not present

## 2017-06-29 DIAGNOSIS — R197 Diarrhea, unspecified: Secondary | ICD-10-CM | POA: Diagnosis not present

## 2017-06-29 DIAGNOSIS — Z1159 Encounter for screening for other viral diseases: Secondary | ICD-10-CM | POA: Diagnosis not present

## 2017-06-29 DIAGNOSIS — C61 Malignant neoplasm of prostate: Secondary | ICD-10-CM | POA: Diagnosis not present

## 2017-06-29 DIAGNOSIS — E785 Hyperlipidemia, unspecified: Secondary | ICD-10-CM | POA: Diagnosis not present

## 2017-06-29 DIAGNOSIS — K219 Gastro-esophageal reflux disease without esophagitis: Secondary | ICD-10-CM | POA: Diagnosis not present

## 2017-06-29 DIAGNOSIS — Z7189 Other specified counseling: Secondary | ICD-10-CM | POA: Diagnosis not present

## 2017-06-29 DIAGNOSIS — Z0000 Encounter for general adult medical examination without abnormal findings: Secondary | ICD-10-CM | POA: Diagnosis not present

## 2017-06-29 DIAGNOSIS — J309 Allergic rhinitis, unspecified: Secondary | ICD-10-CM | POA: Diagnosis not present

## 2017-06-29 DIAGNOSIS — I10 Essential (primary) hypertension: Secondary | ICD-10-CM | POA: Diagnosis not present

## 2017-06-29 DIAGNOSIS — H409 Unspecified glaucoma: Secondary | ICD-10-CM | POA: Diagnosis not present

## 2017-07-02 DIAGNOSIS — H409 Unspecified glaucoma: Secondary | ICD-10-CM | POA: Diagnosis not present

## 2017-07-02 DIAGNOSIS — Z0000 Encounter for general adult medical examination without abnormal findings: Secondary | ICD-10-CM | POA: Diagnosis not present

## 2017-07-02 DIAGNOSIS — K219 Gastro-esophageal reflux disease without esophagitis: Secondary | ICD-10-CM | POA: Diagnosis not present

## 2017-07-02 DIAGNOSIS — Z1389 Encounter for screening for other disorder: Secondary | ICD-10-CM | POA: Diagnosis not present

## 2017-07-02 DIAGNOSIS — C61 Malignant neoplasm of prostate: Secondary | ICD-10-CM | POA: Diagnosis not present

## 2017-07-02 DIAGNOSIS — E785 Hyperlipidemia, unspecified: Secondary | ICD-10-CM | POA: Diagnosis not present

## 2017-07-02 DIAGNOSIS — I10 Essential (primary) hypertension: Secondary | ICD-10-CM | POA: Diagnosis not present

## 2017-07-02 DIAGNOSIS — Z86010 Personal history of colonic polyps: Secondary | ICD-10-CM | POA: Diagnosis not present

## 2017-07-13 DIAGNOSIS — H2513 Age-related nuclear cataract, bilateral: Secondary | ICD-10-CM | POA: Diagnosis not present

## 2017-07-13 DIAGNOSIS — H4089 Other specified glaucoma: Secondary | ICD-10-CM | POA: Diagnosis not present

## 2017-08-13 DIAGNOSIS — R198 Other specified symptoms and signs involving the digestive system and abdomen: Secondary | ICD-10-CM | POA: Diagnosis not present

## 2017-08-13 DIAGNOSIS — K921 Melena: Secondary | ICD-10-CM | POA: Diagnosis not present

## 2017-08-17 DIAGNOSIS — H4089 Other specified glaucoma: Secondary | ICD-10-CM | POA: Diagnosis not present

## 2017-08-17 DIAGNOSIS — H2513 Age-related nuclear cataract, bilateral: Secondary | ICD-10-CM | POA: Diagnosis not present

## 2017-09-29 DIAGNOSIS — K219 Gastro-esophageal reflux disease without esophagitis: Secondary | ICD-10-CM | POA: Diagnosis not present

## 2017-09-29 DIAGNOSIS — C61 Malignant neoplasm of prostate: Secondary | ICD-10-CM | POA: Diagnosis not present

## 2017-09-29 DIAGNOSIS — I10 Essential (primary) hypertension: Secondary | ICD-10-CM | POA: Diagnosis not present

## 2017-09-29 DIAGNOSIS — E785 Hyperlipidemia, unspecified: Secondary | ICD-10-CM | POA: Diagnosis not present

## 2017-09-29 DIAGNOSIS — Z0000 Encounter for general adult medical examination without abnormal findings: Secondary | ICD-10-CM | POA: Diagnosis not present

## 2017-09-29 DIAGNOSIS — Z86010 Personal history of colonic polyps: Secondary | ICD-10-CM | POA: Diagnosis not present

## 2017-09-29 DIAGNOSIS — H409 Unspecified glaucoma: Secondary | ICD-10-CM | POA: Diagnosis not present

## 2017-10-19 DIAGNOSIS — D2272 Melanocytic nevi of left lower limb, including hip: Secondary | ICD-10-CM | POA: Diagnosis not present

## 2017-10-19 DIAGNOSIS — L570 Actinic keratosis: Secondary | ICD-10-CM | POA: Diagnosis not present

## 2017-10-19 DIAGNOSIS — L821 Other seborrheic keratosis: Secondary | ICD-10-CM | POA: Diagnosis not present

## 2017-12-03 DIAGNOSIS — E785 Hyperlipidemia, unspecified: Secondary | ICD-10-CM | POA: Diagnosis not present

## 2017-12-16 DIAGNOSIS — C61 Malignant neoplasm of prostate: Secondary | ICD-10-CM | POA: Diagnosis not present

## 2017-12-23 DIAGNOSIS — C61 Malignant neoplasm of prostate: Secondary | ICD-10-CM | POA: Diagnosis not present

## 2017-12-23 DIAGNOSIS — N5201 Erectile dysfunction due to arterial insufficiency: Secondary | ICD-10-CM | POA: Diagnosis not present

## 2017-12-24 DIAGNOSIS — H2513 Age-related nuclear cataract, bilateral: Secondary | ICD-10-CM | POA: Diagnosis not present

## 2017-12-24 DIAGNOSIS — H4089 Other specified glaucoma: Secondary | ICD-10-CM | POA: Diagnosis not present

## 2018-01-14 DIAGNOSIS — Z23 Encounter for immunization: Secondary | ICD-10-CM | POA: Diagnosis not present

## 2018-01-15 DIAGNOSIS — M62838 Other muscle spasm: Secondary | ICD-10-CM | POA: Diagnosis not present

## 2018-02-15 DIAGNOSIS — H35373 Puckering of macula, bilateral: Secondary | ICD-10-CM | POA: Diagnosis not present

## 2018-02-15 DIAGNOSIS — H4089 Other specified glaucoma: Secondary | ICD-10-CM | POA: Diagnosis not present

## 2018-02-15 DIAGNOSIS — H2513 Age-related nuclear cataract, bilateral: Secondary | ICD-10-CM | POA: Diagnosis not present

## 2018-04-15 DIAGNOSIS — H4089 Other specified glaucoma: Secondary | ICD-10-CM | POA: Diagnosis not present

## 2018-04-26 DIAGNOSIS — D2272 Melanocytic nevi of left lower limb, including hip: Secondary | ICD-10-CM | POA: Diagnosis not present

## 2018-04-26 DIAGNOSIS — L814 Other melanin hyperpigmentation: Secondary | ICD-10-CM | POA: Diagnosis not present

## 2018-04-26 DIAGNOSIS — L821 Other seborrheic keratosis: Secondary | ICD-10-CM | POA: Diagnosis not present

## 2018-04-26 DIAGNOSIS — D485 Neoplasm of uncertain behavior of skin: Secondary | ICD-10-CM | POA: Diagnosis not present

## 2018-04-26 DIAGNOSIS — L570 Actinic keratosis: Secondary | ICD-10-CM | POA: Diagnosis not present

## 2018-05-31 DIAGNOSIS — H4089 Other specified glaucoma: Secondary | ICD-10-CM | POA: Diagnosis not present

## 2018-05-31 DIAGNOSIS — H2513 Age-related nuclear cataract, bilateral: Secondary | ICD-10-CM | POA: Diagnosis not present

## 2018-06-16 DIAGNOSIS — K317 Polyp of stomach and duodenum: Secondary | ICD-10-CM | POA: Diagnosis not present

## 2018-06-16 DIAGNOSIS — K2930 Chronic superficial gastritis without bleeding: Secondary | ICD-10-CM | POA: Diagnosis not present

## 2018-06-22 DIAGNOSIS — K2930 Chronic superficial gastritis without bleeding: Secondary | ICD-10-CM | POA: Diagnosis not present

## 2018-06-29 DIAGNOSIS — C61 Malignant neoplasm of prostate: Secondary | ICD-10-CM | POA: Diagnosis not present

## 2018-07-06 DIAGNOSIS — C61 Malignant neoplasm of prostate: Secondary | ICD-10-CM | POA: Diagnosis not present

## 2018-07-06 DIAGNOSIS — N5201 Erectile dysfunction due to arterial insufficiency: Secondary | ICD-10-CM | POA: Diagnosis not present

## 2018-07-21 DIAGNOSIS — E785 Hyperlipidemia, unspecified: Secondary | ICD-10-CM | POA: Diagnosis not present

## 2018-07-23 DIAGNOSIS — H409 Unspecified glaucoma: Secondary | ICD-10-CM | POA: Diagnosis not present

## 2018-07-23 DIAGNOSIS — Z1331 Encounter for screening for depression: Secondary | ICD-10-CM | POA: Diagnosis not present

## 2018-07-23 DIAGNOSIS — Z0000 Encounter for general adult medical examination without abnormal findings: Secondary | ICD-10-CM | POA: Diagnosis not present

## 2018-07-23 DIAGNOSIS — C61 Malignant neoplasm of prostate: Secondary | ICD-10-CM | POA: Diagnosis not present

## 2018-07-23 DIAGNOSIS — Z86010 Personal history of colonic polyps: Secondary | ICD-10-CM | POA: Diagnosis not present

## 2018-07-23 DIAGNOSIS — I10 Essential (primary) hypertension: Secondary | ICD-10-CM | POA: Diagnosis not present

## 2018-07-23 DIAGNOSIS — E785 Hyperlipidemia, unspecified: Secondary | ICD-10-CM | POA: Diagnosis not present

## 2018-07-23 DIAGNOSIS — K219 Gastro-esophageal reflux disease without esophagitis: Secondary | ICD-10-CM | POA: Diagnosis not present

## 2018-07-23 DIAGNOSIS — J309 Allergic rhinitis, unspecified: Secondary | ICD-10-CM | POA: Diagnosis not present

## 2018-07-23 DIAGNOSIS — R252 Cramp and spasm: Secondary | ICD-10-CM | POA: Diagnosis not present

## 2018-08-09 DIAGNOSIS — M25512 Pain in left shoulder: Secondary | ICD-10-CM | POA: Diagnosis not present

## 2018-09-09 DIAGNOSIS — R42 Dizziness and giddiness: Secondary | ICD-10-CM | POA: Diagnosis not present

## 2018-09-13 DIAGNOSIS — R55 Syncope and collapse: Secondary | ICD-10-CM | POA: Diagnosis not present

## 2018-09-13 DIAGNOSIS — R42 Dizziness and giddiness: Secondary | ICD-10-CM | POA: Diagnosis not present

## 2018-09-13 DIAGNOSIS — I10 Essential (primary) hypertension: Secondary | ICD-10-CM | POA: Diagnosis not present

## 2018-09-20 DIAGNOSIS — R42 Dizziness and giddiness: Secondary | ICD-10-CM | POA: Diagnosis not present

## 2018-09-20 DIAGNOSIS — Z658 Other specified problems related to psychosocial circumstances: Secondary | ICD-10-CM | POA: Diagnosis not present

## 2018-09-20 DIAGNOSIS — I10 Essential (primary) hypertension: Secondary | ICD-10-CM | POA: Diagnosis not present

## 2018-10-11 DIAGNOSIS — H4089 Other specified glaucoma: Secondary | ICD-10-CM | POA: Diagnosis not present

## 2018-10-11 DIAGNOSIS — H35373 Puckering of macula, bilateral: Secondary | ICD-10-CM | POA: Diagnosis not present

## 2018-10-11 DIAGNOSIS — H2513 Age-related nuclear cataract, bilateral: Secondary | ICD-10-CM | POA: Diagnosis not present

## 2018-10-12 DIAGNOSIS — F411 Generalized anxiety disorder: Secondary | ICD-10-CM | POA: Diagnosis not present

## 2018-10-29 DIAGNOSIS — F411 Generalized anxiety disorder: Secondary | ICD-10-CM | POA: Diagnosis not present

## 2018-11-01 DIAGNOSIS — D2261 Melanocytic nevi of right upper limb, including shoulder: Secondary | ICD-10-CM | POA: Diagnosis not present

## 2018-11-01 DIAGNOSIS — L570 Actinic keratosis: Secondary | ICD-10-CM | POA: Diagnosis not present

## 2018-11-01 DIAGNOSIS — D2262 Melanocytic nevi of left upper limb, including shoulder: Secondary | ICD-10-CM | POA: Diagnosis not present

## 2018-11-01 DIAGNOSIS — D2272 Melanocytic nevi of left lower limb, including hip: Secondary | ICD-10-CM | POA: Diagnosis not present

## 2018-11-01 DIAGNOSIS — D225 Melanocytic nevi of trunk: Secondary | ICD-10-CM | POA: Diagnosis not present

## 2018-11-01 DIAGNOSIS — L821 Other seborrheic keratosis: Secondary | ICD-10-CM | POA: Diagnosis not present

## 2018-12-02 DIAGNOSIS — H2513 Age-related nuclear cataract, bilateral: Secondary | ICD-10-CM | POA: Diagnosis not present

## 2018-12-02 DIAGNOSIS — H401134 Primary open-angle glaucoma, bilateral, indeterminate stage: Secondary | ICD-10-CM | POA: Diagnosis not present

## 2019-01-24 DIAGNOSIS — I10 Essential (primary) hypertension: Secondary | ICD-10-CM | POA: Diagnosis not present

## 2019-01-24 DIAGNOSIS — Z86010 Personal history of colonic polyps: Secondary | ICD-10-CM | POA: Diagnosis not present

## 2019-01-24 DIAGNOSIS — H409 Unspecified glaucoma: Secondary | ICD-10-CM | POA: Diagnosis not present

## 2019-01-24 DIAGNOSIS — K219 Gastro-esophageal reflux disease without esophagitis: Secondary | ICD-10-CM | POA: Diagnosis not present

## 2019-01-24 DIAGNOSIS — E785 Hyperlipidemia, unspecified: Secondary | ICD-10-CM | POA: Diagnosis not present

## 2019-01-24 DIAGNOSIS — C61 Malignant neoplasm of prostate: Secondary | ICD-10-CM | POA: Diagnosis not present

## 2019-01-24 DIAGNOSIS — R05 Cough: Secondary | ICD-10-CM | POA: Diagnosis not present

## 2019-01-24 DIAGNOSIS — J309 Allergic rhinitis, unspecified: Secondary | ICD-10-CM | POA: Diagnosis not present

## 2019-01-24 DIAGNOSIS — F411 Generalized anxiety disorder: Secondary | ICD-10-CM | POA: Diagnosis not present

## 2019-01-31 DIAGNOSIS — Z23 Encounter for immunization: Secondary | ICD-10-CM | POA: Diagnosis not present

## 2019-02-16 DIAGNOSIS — C61 Malignant neoplasm of prostate: Secondary | ICD-10-CM | POA: Diagnosis not present

## 2019-05-26 ENCOUNTER — Ambulatory Visit: Payer: Medicare Other | Attending: Internal Medicine

## 2019-05-26 DIAGNOSIS — Z23 Encounter for immunization: Secondary | ICD-10-CM | POA: Insufficient documentation

## 2019-05-26 NOTE — Progress Notes (Signed)
   Covid-19 Vaccination Clinic  Name:  Austin Guerrero    MRN: [id number] DOB: Mar 31, 1946  05/26/2019  [name] was observed post Covid-19 immunization for 15 minutes without incidence. He was provided with Vaccine Information Sheet and instruction to access the V-Safe system.   [name] was instructed to call 911 with any severe reactions post vaccine: Marland Kitchen Difficulty breathing  . Swelling of your face and throat  . A fast heartbeat  . A bad rash all over your body  . Dizziness and weakness    Immunizations Administered    Name Date Dose VIS Date Route   Pfizer COVID-19 Vaccine 05/26/2019  8:43 AM 0.3 mL 04/15/2019 Intramuscular   Manufacturer: [place]   Lot: [id number]   [place]: [id number]

## 2019-06-16 ENCOUNTER — Ambulatory Visit: Payer: Medicare Other | Attending: Internal Medicine

## 2019-06-16 DIAGNOSIS — Z23 Encounter for immunization: Secondary | ICD-10-CM | POA: Insufficient documentation

## 2019-06-16 NOTE — Progress Notes (Signed)
   Covid-19 Vaccination Clinic  Name:  Yoon Clem    MRN: [id number] DOB: Sep 17, 1945  06/16/2019  Mr. Single was observed post Covid-19 immunization for 15 minutes without incidence. He was provided with Vaccine Information Sheet and instruction to access the V-Safe system.   Mr. Finkbiner was instructed to call 911 with any severe reactions post vaccine: Marland Kitchen Difficulty breathing  . Swelling of your face and throat  . A fast heartbeat  . A bad rash all over your body  . Dizziness and weakness    Immunizations Administered    Name Date Dose VIS Date Route   Pfizer COVID-19 Vaccine 06/16/2019  8:40 AM 0.3 mL 04/15/2019 Intramuscular   Manufacturer: [place]   Lot: [id number]   [place]: [id number]

## 2019-06-20 DIAGNOSIS — H25013 Cortical age-related cataract, bilateral: Secondary | ICD-10-CM | POA: Diagnosis not present

## 2019-06-20 DIAGNOSIS — H4089 Other specified glaucoma: Secondary | ICD-10-CM | POA: Diagnosis not present

## 2019-06-20 DIAGNOSIS — H02055 Trichiasis without entropian left lower eyelid: Secondary | ICD-10-CM | POA: Diagnosis not present

## 2019-06-20 DIAGNOSIS — H2513 Age-related nuclear cataract, bilateral: Secondary | ICD-10-CM | POA: Diagnosis not present

## 2019-07-21 DIAGNOSIS — K219 Gastro-esophageal reflux disease without esophagitis: Secondary | ICD-10-CM | POA: Diagnosis not present

## 2019-07-21 DIAGNOSIS — H409 Unspecified glaucoma: Secondary | ICD-10-CM | POA: Diagnosis not present

## 2019-07-21 DIAGNOSIS — F411 Generalized anxiety disorder: Secondary | ICD-10-CM | POA: Diagnosis not present

## 2019-07-21 DIAGNOSIS — I10 Essential (primary) hypertension: Secondary | ICD-10-CM | POA: Diagnosis not present

## 2019-07-21 DIAGNOSIS — E785 Hyperlipidemia, unspecified: Secondary | ICD-10-CM | POA: Diagnosis not present

## 2019-07-21 DIAGNOSIS — J309 Allergic rhinitis, unspecified: Secondary | ICD-10-CM | POA: Diagnosis not present

## 2019-07-21 DIAGNOSIS — C61 Malignant neoplasm of prostate: Secondary | ICD-10-CM | POA: Diagnosis not present

## 2019-07-25 DIAGNOSIS — R7301 Impaired fasting glucose: Secondary | ICD-10-CM | POA: Diagnosis not present

## 2019-07-25 DIAGNOSIS — H409 Unspecified glaucoma: Secondary | ICD-10-CM | POA: Diagnosis not present

## 2019-07-25 DIAGNOSIS — F411 Generalized anxiety disorder: Secondary | ICD-10-CM | POA: Diagnosis not present

## 2019-07-25 DIAGNOSIS — K219 Gastro-esophageal reflux disease without esophagitis: Secondary | ICD-10-CM | POA: Diagnosis not present

## 2019-07-25 DIAGNOSIS — C61 Malignant neoplasm of prostate: Secondary | ICD-10-CM | POA: Diagnosis not present

## 2019-07-25 DIAGNOSIS — E785 Hyperlipidemia, unspecified: Secondary | ICD-10-CM | POA: Diagnosis not present

## 2019-07-25 DIAGNOSIS — J309 Allergic rhinitis, unspecified: Secondary | ICD-10-CM | POA: Diagnosis not present

## 2019-07-25 DIAGNOSIS — I10 Essential (primary) hypertension: Secondary | ICD-10-CM | POA: Diagnosis not present

## 2019-08-23 DIAGNOSIS — I10 Essential (primary) hypertension: Secondary | ICD-10-CM | POA: Diagnosis not present

## 2019-08-23 DIAGNOSIS — Z0000 Encounter for general adult medical examination without abnormal findings: Secondary | ICD-10-CM | POA: Diagnosis not present

## 2019-08-24 DIAGNOSIS — N5201 Erectile dysfunction due to arterial insufficiency: Secondary | ICD-10-CM | POA: Diagnosis not present

## 2019-08-24 DIAGNOSIS — C61 Malignant neoplasm of prostate: Secondary | ICD-10-CM | POA: Diagnosis not present

## 2019-10-17 DIAGNOSIS — E785 Hyperlipidemia, unspecified: Secondary | ICD-10-CM | POA: Diagnosis not present

## 2019-10-17 DIAGNOSIS — I10 Essential (primary) hypertension: Secondary | ICD-10-CM | POA: Diagnosis not present

## 2019-10-17 DIAGNOSIS — C61 Malignant neoplasm of prostate: Secondary | ICD-10-CM | POA: Diagnosis not present

## 2019-10-17 DIAGNOSIS — H409 Unspecified glaucoma: Secondary | ICD-10-CM | POA: Diagnosis not present

## 2019-11-23 DIAGNOSIS — H25013 Cortical age-related cataract, bilateral: Secondary | ICD-10-CM | POA: Diagnosis not present

## 2019-11-23 DIAGNOSIS — H4053X4 Glaucoma secondary to other eye disorders, bilateral, indeterminate stage: Secondary | ICD-10-CM | POA: Diagnosis not present

## 2019-11-23 DIAGNOSIS — H2513 Age-related nuclear cataract, bilateral: Secondary | ICD-10-CM | POA: Diagnosis not present

## 2020-01-02 DIAGNOSIS — E785 Hyperlipidemia, unspecified: Secondary | ICD-10-CM | POA: Diagnosis not present

## 2020-01-02 DIAGNOSIS — I10 Essential (primary) hypertension: Secondary | ICD-10-CM | POA: Diagnosis not present

## 2020-01-02 DIAGNOSIS — H409 Unspecified glaucoma: Secondary | ICD-10-CM | POA: Diagnosis not present

## 2020-01-02 DIAGNOSIS — C61 Malignant neoplasm of prostate: Secondary | ICD-10-CM | POA: Diagnosis not present

## 2020-01-23 DIAGNOSIS — H2513 Age-related nuclear cataract, bilateral: Secondary | ICD-10-CM | POA: Diagnosis not present

## 2020-01-23 DIAGNOSIS — H25013 Cortical age-related cataract, bilateral: Secondary | ICD-10-CM | POA: Diagnosis not present

## 2020-01-23 DIAGNOSIS — H4053X4 Glaucoma secondary to other eye disorders, bilateral, indeterminate stage: Secondary | ICD-10-CM | POA: Diagnosis not present

## 2020-01-26 DIAGNOSIS — J309 Allergic rhinitis, unspecified: Secondary | ICD-10-CM | POA: Diagnosis not present

## 2020-01-26 DIAGNOSIS — R7309 Other abnormal glucose: Secondary | ICD-10-CM | POA: Diagnosis not present

## 2020-01-26 DIAGNOSIS — Z23 Encounter for immunization: Secondary | ICD-10-CM | POA: Diagnosis not present

## 2020-01-26 DIAGNOSIS — C61 Malignant neoplasm of prostate: Secondary | ICD-10-CM | POA: Diagnosis not present

## 2020-01-26 DIAGNOSIS — K219 Gastro-esophageal reflux disease without esophagitis: Secondary | ICD-10-CM | POA: Diagnosis not present

## 2020-01-26 DIAGNOSIS — F411 Generalized anxiety disorder: Secondary | ICD-10-CM | POA: Diagnosis not present

## 2020-01-26 DIAGNOSIS — E785 Hyperlipidemia, unspecified: Secondary | ICD-10-CM | POA: Diagnosis not present

## 2020-01-26 DIAGNOSIS — H409 Unspecified glaucoma: Secondary | ICD-10-CM | POA: Diagnosis not present

## 2020-01-26 DIAGNOSIS — I10 Essential (primary) hypertension: Secondary | ICD-10-CM | POA: Diagnosis not present

## 2020-02-01 DIAGNOSIS — H409 Unspecified glaucoma: Secondary | ICD-10-CM | POA: Diagnosis not present

## 2020-02-01 DIAGNOSIS — C61 Malignant neoplasm of prostate: Secondary | ICD-10-CM | POA: Diagnosis not present

## 2020-02-01 DIAGNOSIS — E785 Hyperlipidemia, unspecified: Secondary | ICD-10-CM | POA: Diagnosis not present

## 2020-02-01 DIAGNOSIS — I10 Essential (primary) hypertension: Secondary | ICD-10-CM | POA: Diagnosis not present

## 2020-02-02 DIAGNOSIS — H409 Unspecified glaucoma: Secondary | ICD-10-CM | POA: Diagnosis not present

## 2020-02-02 DIAGNOSIS — I10 Essential (primary) hypertension: Secondary | ICD-10-CM | POA: Diagnosis not present

## 2020-02-02 DIAGNOSIS — J309 Allergic rhinitis, unspecified: Secondary | ICD-10-CM | POA: Diagnosis not present

## 2020-02-02 DIAGNOSIS — E785 Hyperlipidemia, unspecified: Secondary | ICD-10-CM | POA: Diagnosis not present

## 2020-02-02 DIAGNOSIS — C61 Malignant neoplasm of prostate: Secondary | ICD-10-CM | POA: Diagnosis not present

## 2020-02-02 DIAGNOSIS — E871 Hypo-osmolality and hyponatremia: Secondary | ICD-10-CM | POA: Diagnosis not present

## 2020-02-02 DIAGNOSIS — K219 Gastro-esophageal reflux disease without esophagitis: Secondary | ICD-10-CM | POA: Diagnosis not present

## 2020-02-02 DIAGNOSIS — F411 Generalized anxiety disorder: Secondary | ICD-10-CM | POA: Diagnosis not present

## 2020-02-02 DIAGNOSIS — R7309 Other abnormal glucose: Secondary | ICD-10-CM | POA: Diagnosis not present

## 2020-02-08 DIAGNOSIS — N5201 Erectile dysfunction due to arterial insufficiency: Secondary | ICD-10-CM | POA: Diagnosis not present

## 2020-02-08 DIAGNOSIS — E785 Hyperlipidemia, unspecified: Secondary | ICD-10-CM | POA: Diagnosis not present

## 2020-02-08 DIAGNOSIS — H409 Unspecified glaucoma: Secondary | ICD-10-CM | POA: Diagnosis not present

## 2020-02-08 DIAGNOSIS — I10 Essential (primary) hypertension: Secondary | ICD-10-CM | POA: Diagnosis not present

## 2020-02-08 DIAGNOSIS — C61 Malignant neoplasm of prostate: Secondary | ICD-10-CM | POA: Diagnosis not present

## 2020-03-01 DIAGNOSIS — H4053X4 Glaucoma secondary to other eye disorders, bilateral, indeterminate stage: Secondary | ICD-10-CM | POA: Diagnosis not present

## 2020-03-05 ENCOUNTER — Encounter (HOSPITAL_COMMUNITY): Payer: Self-pay

## 2020-03-05 ENCOUNTER — Other Ambulatory Visit: Payer: Self-pay

## 2020-03-05 ENCOUNTER — Ambulatory Visit (HOSPITAL_COMMUNITY)
Admission: RE | Admit: 2020-03-05 | Discharge: 2020-03-05 | Disposition: A | Discharge status: Home / Self Care | Payer: Medicare Other | Source: Ambulatory Visit | Attending: Family Medicine | Admitting: Family Medicine

## 2020-03-05 VITALS — BP 131/66 | HR 93 | Temp 99.0°F | Resp 18

## 2020-03-05 DIAGNOSIS — R051 Acute cough: Secondary | ICD-10-CM | POA: Diagnosis not present

## 2020-03-05 DIAGNOSIS — Z79899 Other long term (current) drug therapy: Secondary | ICD-10-CM | POA: Insufficient documentation

## 2020-03-05 DIAGNOSIS — R059 Cough, unspecified: Secondary | ICD-10-CM | POA: Diagnosis present

## 2020-03-05 DIAGNOSIS — Z7982 Long term (current) use of aspirin: Secondary | ICD-10-CM | POA: Insufficient documentation

## 2020-03-05 DIAGNOSIS — J22 Unspecified acute lower respiratory infection: Secondary | ICD-10-CM | POA: Diagnosis not present

## 2020-03-05 DIAGNOSIS — Z20822 Contact with and (suspected) exposure to covid-19: Secondary | ICD-10-CM | POA: Insufficient documentation

## 2020-03-05 MED ORDER — AZITHROMYCIN 250 MG PO TABS: 250.0000 mg | ORAL_TABLET | Freq: Every day | ORAL | 0 refills | Status: DC

## 2020-03-05 MED ORDER — BENZONATATE 200 MG PO CAPS: 200.0000 mg | ORAL_CAPSULE | Freq: Two times a day (BID) | ORAL | 0 refills | Status: DC | PRN

## 2020-03-05 NOTE — ED Provider Notes (Signed)
[facility]    CSN: [id number] Arrival date & time: 03/05/20  [place]      History   Chief Complaint Chief Complaint  Patient presents with  . Cough    HPI Ervey Fallin is a 74 y.o. male.   HPI  .  Patient does not have any underlying lung disease such as asthma or emphysema.  He has been sick for 11 days with worsening symptoms.  He still has a harsh cough and coughing up green sputum.  He feels fatigued.  No shortness of breath.  No chest pain.  He does have some runny nose and postnasal drip.  No headache or body aches.  Wife is also sick with a cough. Patient has had both Covid vaccinations, this years flu shot, and his pneumonia vaccinations. He is using over-the-counter Robitussin-DM some improvement in the cough, but is concerned regarding worsening symptoms. He did a Covid test a couple days ago and it was negative  Past Medical History:  Diagnosis Date  . Allergic rhinitis   . GERD (gastroesophageal reflux disease)   . Glaucoma   . Glaucoma   . Hyperlipidemia   . Hypertension   . Prostate cancer ([place])   . Prostate cancer ([place]) 02/21/15    Patient Active Problem List   Diagnosis Date Noted  . Prostate cancer ([place])   . Hypertension   . Hyperlipidemia   . GERD (gastroesophageal reflux disease)   . Allergic rhinitis   . Glaucoma     Past Surgical History:  Procedure Laterality Date  . EYE SURGERY     laser surgery for glaucoma  2016  . FLEXIBLE SIGMOIDOSCOPY N/A 04/21/2017   Procedure: FLEXIBLE SIGMOIDOSCOPY;  Surgeon: Clarene Essex, MD;  Location: [facility];  Service: Endoscopy;  Laterality: N/A;  RFA  . INGUINAL HERNIA REPAIR Right 08/27/2015   Procedure: LAPAROSCOPIC RIGHT  INGUINAL HERNIA REPAIR;  Surgeon: Judeth Horn, MD;  Location: [facility];  Service: General;  Laterality: Right;  . INSERTION OF MESH Right 08/27/2015   Procedure: INSERTION OF MESH;  Surgeon: Judeth Horn, MD;  Location: [facility];  Service: General;  Laterality: Right;  .  TONSILLECTOMY    . VASECTOMY         Home Medications    Prior to Admission medications   Medication Sig Start Date End Date Taking? Authorizing Provider  amLODipine (NORVASC) 5 MG tablet 1 tablet 02/02/20  Yes [provider]  atorvastatin (LIPITOR) 20 MG tablet Take 20 mg by mouth daily.    Yes [provider]  Calcium Carb-Cholecalciferol (CALCIUM 600+D3) 600-800 MG-UNIT TABS Take 1 tablet by mouth 2 (two) times daily.   Yes [provider]  desloratadine (CLARINEX) 5 MG tablet Take 5 mg by mouth as needed (allergies).    Yes [provider]  escitalopram (LEXAPRO) 20 MG tablet Take 20 mg by mouth daily. 02/10/20  Yes [provider]  hydrochlorothiazide (HYDRODIURIL) 25 MG tablet Take 25 mg by mouth daily.  02/09/14  Yes [provider]  latanoprost (XALATAN) 0.005 % ophthalmic solution Place 1 drop into the left eye at bedtime.   Yes [provider]  losartan (COZAAR) 50 MG tablet Take 50 mg by mouth 2 (two) times daily. 01/25/20  Yes [provider]  Multiple Vitamin (MULTIVITAMIN) tablet Take 1 tablet by mouth daily.   Yes [provider]  pantoprazole (PROTONIX) 20 MG tablet Take 20 mg by mouth daily.   Yes [provider]  vitamin C (ASCORBIC  ACID) 500 MG tablet Take 500 mg by mouth daily.   Yes [provider]  aspirin EC 81 MG tablet Take 81 mg by mouth daily.    [provider]  azithromycin (ZITHROMAX) 250 MG tablet Take 1 tablet (250 mg total) by mouth daily. Take first 2 tablets together, then 1 every day until finished. 03/05/20   Raylene Everts, MD  benzonatate (TESSALON) 200 MG capsule Take 1 capsule (200 mg total) by mouth 2 (two) times daily as needed for cough. 03/05/20   Raylene Everts, MD  ibuprofen (ADVIL,MOTRIN) 200 MG tablet Take 400 mg by mouth daily as needed for headache or moderate pain.    [provider]  naproxen sodium (ALEVE) 220 MG tablet  Take 440 mg by mouth daily as needed (pain).    [provider]    Family History Family History  Problem Relation Age of Onset  . Atrial fibrillation Mother   . Heart failure Mother   . Cancer - Lung Father   . Prostate cancer Father   . Arrhythmia Father   . Hypertension Brother   . Hyperlipidemia Brother     Social History Social History   Tobacco Use  . Smoking status: Never Smoker  . Smokeless tobacco: Never Used  Substance Use Topics  . Alcohol use: Yes    Alcohol/week: 0.0 standard drinks    Comment: OCC DRINKER  . Drug use: No     Allergies   Patient has no known allergies.   Review of Systems Review of Systems See HPI  Physical Exam Triage Vital Signs ED Triage Vitals  Enc Vitals Group     BP 03/05/20 1711 131/66     Pulse Rate 03/05/20 1711 93     Resp 03/05/20 1711 18     Temp 03/05/20 1711 99 F (37.2 C)     Temp Source 03/05/20 1711 Oral     SpO2 03/05/20 1711 97 %     Weight --      Height --      Head Circumference --      Peak Flow --      Pain Score 03/05/20 1707 0     Pain Loc --      Pain Edu? --      Excl. in [place]? --    No data found.  Updated Vital Signs BP 131/66 (BP Location: Left Arm)   Pulse 93   Temp 99 F (37.2 C) (Oral)   Resp 18   SpO2 97%     Physical Exam Constitutional:      General: He is not in acute distress.    Appearance: He is well-developed.     Comments: Appears vigorous for age  HENT:     Head: Normocephalic and atraumatic.     Right Ear: Tympanic membrane and ear canal normal.     Left Ear: Tympanic membrane and ear canal normal.     Nose: Congestion present.     Mouth/Throat:     Mouth: Mucous membranes are dry.     Pharynx: No posterior oropharyngeal erythema.  Eyes:     Conjunctiva/sclera: Conjunctivae normal.     Pupils: Pupils are equal, round, and reactive to light.  Cardiovascular:     Rate and Rhythm: Normal rate.  Pulmonary:     Effort: Pulmonary effort is normal. No  respiratory distress.     Breath sounds: Normal breath sounds. No rhonchi.  Abdominal:     General:  There is no distension.     Palpations: Abdomen is soft.  Musculoskeletal:        General: Normal range of motion.     Cervical back: Normal range of motion.  Skin:    General: Skin is warm and dry.  Neurological:     Mental Status: He is alert.  Psychiatric:        Mood and Affect: Mood normal.        Behavior: Behavior normal.      UC Treatments / Results  Labs (all labs ordered are listed, but only abnormal results are displayed) Labs Reviewed  SARS CORONAVIRUS 2 (TAT 6-24 HRS)    EKG   Radiology No results found.  Procedures Procedures (including critical care time)  Medications Ordered in UC Medications - No data to display  Initial Impression / Assessment and Plan / UC Course  I have reviewed the triage vital signs and the nursing notes.  Pertinent labs & imaging results that were available during my care of the patient were reviewed by me and considered in my medical decision making (see chart for details).     With 10 days of symptoms would add an antibiotic.  No indication for prednisone.  Discussed cough management.  Increase fluids. Final Clinical Impressions(s) / UC Diagnoses   Final diagnoses:  LRTI (lower respiratory tract infection)     Discharge Instructions     For cough control take a DM product (like Delsym or Mucinex DM-12-hour) with Tessalon 2 times a day Drink plenty of water Take Z-Pak as directed   ED Prescriptions    Medication Sig Dispense Auth. Provider   azithromycin (ZITHROMAX) 250 MG tablet Take 1 tablet (250 mg total) by mouth daily. Take first 2 tablets together, then 1 every day until finished. 6 tablet Raylene Everts, MD   benzonatate (TESSALON) 200 MG capsule Take 1 capsule (200 mg total) by mouth 2 (two) times daily as needed for cough. 20 capsule Raylene Everts, MD     PDMP not reviewed this encounter.     Raylene Everts, MD 03/05/20 [phone]

## 2020-03-05 NOTE — ED Triage Notes (Addendum)
Pt c/o productive cough, congestion, runny nose with green sputum for approx 10 days. Had a rapid COVID rapid test lasts Wednesday with negative result.  Denies fever, SOB, n/v/d, abdominal pain, loss of taste/smell Has been taking robitussin cough suppressant w/ only mild improvement in symptoms

## 2020-03-05 NOTE — Discharge Instructions (Addendum)
For cough control take a DM product (like Delsym or Mucinex DM-12-hour) with Tessalon 2 times a day Drink plenty of water Take Z-Pak as directed 

## 2020-03-06 LAB — SARS CORONAVIRUS 2 (TAT 6-24 HRS): SARS Coronavirus 2: NEGATIVE

## 2020-04-13 DIAGNOSIS — H25013 Cortical age-related cataract, bilateral: Secondary | ICD-10-CM | POA: Diagnosis not present

## 2020-04-13 DIAGNOSIS — H4053X4 Glaucoma secondary to other eye disorders, bilateral, indeterminate stage: Secondary | ICD-10-CM | POA: Diagnosis not present

## 2020-04-13 DIAGNOSIS — H2513 Age-related nuclear cataract, bilateral: Secondary | ICD-10-CM | POA: Diagnosis not present

## 2020-06-08 DIAGNOSIS — H4089 Other specified glaucoma: Secondary | ICD-10-CM | POA: Diagnosis not present

## 2020-06-11 DIAGNOSIS — R198 Other specified symptoms and signs involving the digestive system and abdomen: Secondary | ICD-10-CM | POA: Diagnosis not present

## 2020-06-11 DIAGNOSIS — D132 Benign neoplasm of duodenum: Secondary | ICD-10-CM | POA: Diagnosis not present

## 2020-06-11 DIAGNOSIS — Z86010 Personal history of colonic polyps: Secondary | ICD-10-CM | POA: Diagnosis not present

## 2020-06-28 DIAGNOSIS — Z01812 Encounter for preprocedural laboratory examination: Secondary | ICD-10-CM | POA: Diagnosis not present

## 2020-07-03 DIAGNOSIS — K648 Other hemorrhoids: Secondary | ICD-10-CM | POA: Diagnosis not present

## 2020-07-03 DIAGNOSIS — K635 Polyp of colon: Secondary | ICD-10-CM | POA: Diagnosis not present

## 2020-07-03 DIAGNOSIS — K5730 Diverticulosis of large intestine without perforation or abscess without bleeding: Secondary | ICD-10-CM | POA: Diagnosis not present

## 2020-07-03 DIAGNOSIS — Z86010 Personal history of colonic polyps: Secondary | ICD-10-CM | POA: Diagnosis not present

## 2020-07-03 DIAGNOSIS — K317 Polyp of stomach and duodenum: Secondary | ICD-10-CM | POA: Diagnosis not present

## 2020-07-03 DIAGNOSIS — K2970 Gastritis, unspecified, without bleeding: Secondary | ICD-10-CM | POA: Diagnosis not present

## 2020-07-03 DIAGNOSIS — K644 Residual hemorrhoidal skin tags: Secondary | ICD-10-CM | POA: Diagnosis not present

## 2020-07-06 DIAGNOSIS — K635 Polyp of colon: Secondary | ICD-10-CM | POA: Diagnosis not present

## 2020-07-06 DIAGNOSIS — K2970 Gastritis, unspecified, without bleeding: Secondary | ICD-10-CM | POA: Diagnosis not present

## 2020-07-06 DIAGNOSIS — K317 Polyp of stomach and duodenum: Secondary | ICD-10-CM | POA: Diagnosis not present

## 2020-07-12 DIAGNOSIS — H2513 Age-related nuclear cataract, bilateral: Secondary | ICD-10-CM | POA: Diagnosis not present

## 2020-07-12 DIAGNOSIS — H4089 Other specified glaucoma: Secondary | ICD-10-CM | POA: Diagnosis not present

## 2020-07-24 DIAGNOSIS — H2513 Age-related nuclear cataract, bilateral: Secondary | ICD-10-CM | POA: Diagnosis not present

## 2020-07-24 DIAGNOSIS — H4089 Other specified glaucoma: Secondary | ICD-10-CM | POA: Diagnosis not present

## 2020-07-31 DIAGNOSIS — R7309 Other abnormal glucose: Secondary | ICD-10-CM | POA: Diagnosis not present

## 2020-07-31 DIAGNOSIS — Z1159 Encounter for screening for other viral diseases: Secondary | ICD-10-CM | POA: Diagnosis not present

## 2020-07-31 DIAGNOSIS — K219 Gastro-esophageal reflux disease without esophagitis: Secondary | ICD-10-CM | POA: Diagnosis not present

## 2020-07-31 DIAGNOSIS — C61 Malignant neoplasm of prostate: Secondary | ICD-10-CM | POA: Diagnosis not present

## 2020-07-31 DIAGNOSIS — I10 Essential (primary) hypertension: Secondary | ICD-10-CM | POA: Diagnosis not present

## 2020-07-31 DIAGNOSIS — J309 Allergic rhinitis, unspecified: Secondary | ICD-10-CM | POA: Diagnosis not present

## 2020-07-31 DIAGNOSIS — H409 Unspecified glaucoma: Secondary | ICD-10-CM | POA: Diagnosis not present

## 2020-07-31 DIAGNOSIS — E785 Hyperlipidemia, unspecified: Secondary | ICD-10-CM | POA: Diagnosis not present

## 2020-07-31 DIAGNOSIS — F411 Generalized anxiety disorder: Secondary | ICD-10-CM | POA: Diagnosis not present

## 2020-07-31 DIAGNOSIS — E871 Hypo-osmolality and hyponatremia: Secondary | ICD-10-CM | POA: Diagnosis not present

## 2020-08-03 DIAGNOSIS — H409 Unspecified glaucoma: Secondary | ICD-10-CM | POA: Diagnosis not present

## 2020-08-03 DIAGNOSIS — C61 Malignant neoplasm of prostate: Secondary | ICD-10-CM | POA: Diagnosis not present

## 2020-08-03 DIAGNOSIS — E785 Hyperlipidemia, unspecified: Secondary | ICD-10-CM | POA: Diagnosis not present

## 2020-08-03 DIAGNOSIS — R7301 Impaired fasting glucose: Secondary | ICD-10-CM | POA: Diagnosis not present

## 2020-08-03 DIAGNOSIS — K219 Gastro-esophageal reflux disease without esophagitis: Secondary | ICD-10-CM | POA: Diagnosis not present

## 2020-08-03 DIAGNOSIS — K5730 Diverticulosis of large intestine without perforation or abscess without bleeding: Secondary | ICD-10-CM | POA: Diagnosis not present

## 2020-08-03 DIAGNOSIS — I10 Essential (primary) hypertension: Secondary | ICD-10-CM | POA: Diagnosis not present

## 2020-08-03 DIAGNOSIS — R198 Other specified symptoms and signs involving the digestive system and abdomen: Secondary | ICD-10-CM | POA: Diagnosis not present

## 2020-08-03 DIAGNOSIS — J309 Allergic rhinitis, unspecified: Secondary | ICD-10-CM | POA: Diagnosis not present

## 2020-08-03 DIAGNOSIS — F411 Generalized anxiety disorder: Secondary | ICD-10-CM | POA: Diagnosis not present

## 2020-08-13 DIAGNOSIS — L821 Other seborrheic keratosis: Secondary | ICD-10-CM | POA: Diagnosis not present

## 2020-08-13 DIAGNOSIS — L570 Actinic keratosis: Secondary | ICD-10-CM | POA: Diagnosis not present

## 2020-08-16 DIAGNOSIS — K219 Gastro-esophageal reflux disease without esophagitis: Secondary | ICD-10-CM | POA: Diagnosis not present

## 2020-08-16 DIAGNOSIS — E785 Hyperlipidemia, unspecified: Secondary | ICD-10-CM | POA: Diagnosis not present

## 2020-08-16 DIAGNOSIS — C61 Malignant neoplasm of prostate: Secondary | ICD-10-CM | POA: Diagnosis not present

## 2020-08-16 DIAGNOSIS — I10 Essential (primary) hypertension: Secondary | ICD-10-CM | POA: Diagnosis not present

## 2020-08-16 DIAGNOSIS — H409 Unspecified glaucoma: Secondary | ICD-10-CM | POA: Diagnosis not present

## 2020-08-22 DIAGNOSIS — C61 Malignant neoplasm of prostate: Secondary | ICD-10-CM | POA: Diagnosis not present

## 2020-08-31 DIAGNOSIS — C61 Malignant neoplasm of prostate: Secondary | ICD-10-CM | POA: Diagnosis not present

## 2020-08-31 DIAGNOSIS — R3912 Poor urinary stream: Secondary | ICD-10-CM | POA: Diagnosis not present

## 2020-08-31 DIAGNOSIS — N401 Enlarged prostate with lower urinary tract symptoms: Secondary | ICD-10-CM | POA: Diagnosis not present

## 2020-09-03 DIAGNOSIS — Z0000 Encounter for general adult medical examination without abnormal findings: Secondary | ICD-10-CM | POA: Diagnosis not present

## 2020-10-01 DIAGNOSIS — Z1152 Encounter for screening for COVID-19: Secondary | ICD-10-CM | POA: Diagnosis not present

## 2020-10-29 DIAGNOSIS — H2513 Age-related nuclear cataract, bilateral: Secondary | ICD-10-CM | POA: Diagnosis not present

## 2020-10-29 DIAGNOSIS — H4089 Other specified glaucoma: Secondary | ICD-10-CM | POA: Diagnosis not present

## 2020-10-31 DIAGNOSIS — L821 Other seborrheic keratosis: Secondary | ICD-10-CM | POA: Diagnosis not present

## 2020-10-31 DIAGNOSIS — D2261 Melanocytic nevi of right upper limb, including shoulder: Secondary | ICD-10-CM | POA: Diagnosis not present

## 2020-10-31 DIAGNOSIS — L814 Other melanin hyperpigmentation: Secondary | ICD-10-CM | POA: Diagnosis not present

## 2020-10-31 DIAGNOSIS — L570 Actinic keratosis: Secondary | ICD-10-CM | POA: Diagnosis not present

## 2020-10-31 DIAGNOSIS — Z85828 Personal history of other malignant neoplasm of skin: Secondary | ICD-10-CM | POA: Diagnosis not present

## 2020-10-31 DIAGNOSIS — D2262 Melanocytic nevi of left upper limb, including shoulder: Secondary | ICD-10-CM | POA: Diagnosis not present

## 2020-10-31 DIAGNOSIS — D1801 Hemangioma of skin and subcutaneous tissue: Secondary | ICD-10-CM | POA: Diagnosis not present

## 2020-11-26 DIAGNOSIS — R42 Dizziness and giddiness: Secondary | ICD-10-CM | POA: Diagnosis not present

## 2020-11-26 DIAGNOSIS — R000 Tachycardia, unspecified: Secondary | ICD-10-CM | POA: Diagnosis not present

## 2020-11-27 DIAGNOSIS — I10 Essential (primary) hypertension: Secondary | ICD-10-CM | POA: Diagnosis not present

## 2020-11-27 DIAGNOSIS — R000 Tachycardia, unspecified: Secondary | ICD-10-CM | POA: Diagnosis not present

## 2021-01-14 DIAGNOSIS — Z23 Encounter for immunization: Secondary | ICD-10-CM | POA: Diagnosis not present

## 2021-02-07 DIAGNOSIS — H409 Unspecified glaucoma: Secondary | ICD-10-CM | POA: Diagnosis not present

## 2021-02-07 DIAGNOSIS — C61 Malignant neoplasm of prostate: Secondary | ICD-10-CM | POA: Diagnosis not present

## 2021-02-07 DIAGNOSIS — R7301 Impaired fasting glucose: Secondary | ICD-10-CM | POA: Diagnosis not present

## 2021-02-07 DIAGNOSIS — Z79899 Other long term (current) drug therapy: Secondary | ICD-10-CM | POA: Diagnosis not present

## 2021-02-07 DIAGNOSIS — R7309 Other abnormal glucose: Secondary | ICD-10-CM | POA: Diagnosis not present

## 2021-02-07 DIAGNOSIS — K219 Gastro-esophageal reflux disease without esophagitis: Secondary | ICD-10-CM | POA: Diagnosis not present

## 2021-02-07 DIAGNOSIS — F411 Generalized anxiety disorder: Secondary | ICD-10-CM | POA: Diagnosis not present

## 2021-02-07 DIAGNOSIS — G479 Sleep disorder, unspecified: Secondary | ICD-10-CM | POA: Diagnosis not present

## 2021-02-07 DIAGNOSIS — I10 Essential (primary) hypertension: Secondary | ICD-10-CM | POA: Diagnosis not present

## 2021-03-12 DIAGNOSIS — L820 Inflamed seborrheic keratosis: Secondary | ICD-10-CM | POA: Diagnosis not present

## 2021-03-12 DIAGNOSIS — L821 Other seborrheic keratosis: Secondary | ICD-10-CM | POA: Diagnosis not present

## 2021-05-27 DIAGNOSIS — H6121 Impacted cerumen, right ear: Secondary | ICD-10-CM | POA: Diagnosis not present

## 2021-05-30 DIAGNOSIS — D485 Neoplasm of uncertain behavior of skin: Secondary | ICD-10-CM | POA: Diagnosis not present

## 2021-05-30 DIAGNOSIS — H02411 Mechanical ptosis of right eyelid: Secondary | ICD-10-CM | POA: Diagnosis not present

## 2021-06-13 DIAGNOSIS — L820 Inflamed seborrheic keratosis: Secondary | ICD-10-CM | POA: Diagnosis not present

## 2021-06-13 DIAGNOSIS — D485 Neoplasm of uncertain behavior of skin: Secondary | ICD-10-CM | POA: Diagnosis not present

## 2021-06-25 DIAGNOSIS — Z85828 Personal history of other malignant neoplasm of skin: Secondary | ICD-10-CM | POA: Diagnosis not present

## 2021-06-25 DIAGNOSIS — L821 Other seborrheic keratosis: Secondary | ICD-10-CM | POA: Diagnosis not present

## 2021-06-25 DIAGNOSIS — D2272 Melanocytic nevi of left lower limb, including hip: Secondary | ICD-10-CM | POA: Diagnosis not present

## 2021-06-25 DIAGNOSIS — D225 Melanocytic nevi of trunk: Secondary | ICD-10-CM | POA: Diagnosis not present

## 2021-06-25 DIAGNOSIS — L570 Actinic keratosis: Secondary | ICD-10-CM | POA: Diagnosis not present

## 2021-07-10 DIAGNOSIS — H25813 Combined forms of age-related cataract, bilateral: Secondary | ICD-10-CM | POA: Diagnosis not present

## 2021-07-10 DIAGNOSIS — H4089 Other specified glaucoma: Secondary | ICD-10-CM | POA: Diagnosis not present

## 2021-07-30 DIAGNOSIS — H4089 Other specified glaucoma: Secondary | ICD-10-CM | POA: Diagnosis not present

## 2021-08-08 DIAGNOSIS — E785 Hyperlipidemia, unspecified: Secondary | ICD-10-CM | POA: Diagnosis not present

## 2021-08-08 DIAGNOSIS — Z79899 Other long term (current) drug therapy: Secondary | ICD-10-CM | POA: Diagnosis not present

## 2021-08-08 DIAGNOSIS — R7301 Impaired fasting glucose: Secondary | ICD-10-CM | POA: Diagnosis not present

## 2021-08-08 DIAGNOSIS — R7309 Other abnormal glucose: Secondary | ICD-10-CM | POA: Diagnosis not present

## 2021-08-12 DIAGNOSIS — Z79899 Other long term (current) drug therapy: Secondary | ICD-10-CM | POA: Diagnosis not present

## 2021-08-12 DIAGNOSIS — I10 Essential (primary) hypertension: Secondary | ICD-10-CM | POA: Diagnosis not present

## 2021-08-12 DIAGNOSIS — C61 Malignant neoplasm of prostate: Secondary | ICD-10-CM | POA: Diagnosis not present

## 2021-08-12 DIAGNOSIS — R7301 Impaired fasting glucose: Secondary | ICD-10-CM | POA: Diagnosis not present

## 2021-08-12 DIAGNOSIS — K219 Gastro-esophageal reflux disease without esophagitis: Secondary | ICD-10-CM | POA: Diagnosis not present

## 2021-08-12 DIAGNOSIS — J309 Allergic rhinitis, unspecified: Secondary | ICD-10-CM | POA: Diagnosis not present

## 2021-08-12 DIAGNOSIS — E785 Hyperlipidemia, unspecified: Secondary | ICD-10-CM | POA: Diagnosis not present

## 2021-08-12 DIAGNOSIS — H409 Unspecified glaucoma: Secondary | ICD-10-CM | POA: Diagnosis not present

## 2021-08-12 DIAGNOSIS — F411 Generalized anxiety disorder: Secondary | ICD-10-CM | POA: Diagnosis not present

## 2021-08-12 DIAGNOSIS — G479 Sleep disorder, unspecified: Secondary | ICD-10-CM | POA: Diagnosis not present

## 2021-08-21 DIAGNOSIS — C61 Malignant neoplasm of prostate: Secondary | ICD-10-CM | POA: Diagnosis not present

## 2021-08-28 DIAGNOSIS — C61 Malignant neoplasm of prostate: Secondary | ICD-10-CM | POA: Diagnosis not present

## 2021-08-28 DIAGNOSIS — R3912 Poor urinary stream: Secondary | ICD-10-CM | POA: Diagnosis not present

## 2021-08-28 DIAGNOSIS — N401 Enlarged prostate with lower urinary tract symptoms: Secondary | ICD-10-CM | POA: Diagnosis not present

## 2021-09-20 DIAGNOSIS — Z1389 Encounter for screening for other disorder: Secondary | ICD-10-CM | POA: Diagnosis not present

## 2021-09-20 DIAGNOSIS — Z0000 Encounter for general adult medical examination without abnormal findings: Secondary | ICD-10-CM | POA: Diagnosis not present

## 2021-10-16 DIAGNOSIS — N3941 Urge incontinence: Secondary | ICD-10-CM | POA: Diagnosis not present

## 2021-10-16 DIAGNOSIS — N401 Enlarged prostate with lower urinary tract symptoms: Secondary | ICD-10-CM | POA: Diagnosis not present

## 2021-12-27 DIAGNOSIS — R011 Cardiac murmur, unspecified: Secondary | ICD-10-CM | POA: Diagnosis not present

## 2021-12-27 DIAGNOSIS — R001 Bradycardia, unspecified: Secondary | ICD-10-CM | POA: Diagnosis not present

## 2021-12-30 DIAGNOSIS — I10 Essential (primary) hypertension: Secondary | ICD-10-CM | POA: Diagnosis not present

## 2021-12-30 DIAGNOSIS — Z23 Encounter for immunization: Secondary | ICD-10-CM | POA: Diagnosis not present

## 2021-12-30 DIAGNOSIS — R011 Cardiac murmur, unspecified: Secondary | ICD-10-CM | POA: Diagnosis not present

## 2021-12-30 DIAGNOSIS — E860 Dehydration: Secondary | ICD-10-CM | POA: Diagnosis not present

## 2022-02-05 DIAGNOSIS — H25813 Combined forms of age-related cataract, bilateral: Secondary | ICD-10-CM | POA: Diagnosis not present

## 2022-02-05 DIAGNOSIS — H4089 Other specified glaucoma: Secondary | ICD-10-CM | POA: Diagnosis not present

## 2022-02-12 DIAGNOSIS — R7301 Impaired fasting glucose: Secondary | ICD-10-CM | POA: Diagnosis not present

## 2022-02-12 DIAGNOSIS — R7309 Other abnormal glucose: Secondary | ICD-10-CM | POA: Diagnosis not present

## 2022-02-12 DIAGNOSIS — Z79899 Other long term (current) drug therapy: Secondary | ICD-10-CM | POA: Diagnosis not present

## 2022-02-17 DIAGNOSIS — E785 Hyperlipidemia, unspecified: Secondary | ICD-10-CM | POA: Diagnosis not present

## 2022-02-17 DIAGNOSIS — R7303 Prediabetes: Secondary | ICD-10-CM | POA: Diagnosis not present

## 2022-02-17 DIAGNOSIS — Z23 Encounter for immunization: Secondary | ICD-10-CM | POA: Diagnosis not present

## 2022-02-17 DIAGNOSIS — I10 Essential (primary) hypertension: Secondary | ICD-10-CM | POA: Diagnosis not present

## 2022-02-17 DIAGNOSIS — G479 Sleep disorder, unspecified: Secondary | ICD-10-CM | POA: Diagnosis not present

## 2022-02-17 DIAGNOSIS — F411 Generalized anxiety disorder: Secondary | ICD-10-CM | POA: Diagnosis not present

## 2022-02-17 DIAGNOSIS — K219 Gastro-esophageal reflux disease without esophagitis: Secondary | ICD-10-CM | POA: Diagnosis not present

## 2022-02-17 DIAGNOSIS — Z6826 Body mass index (BMI) 26.0-26.9, adult: Secondary | ICD-10-CM | POA: Diagnosis not present

## 2022-02-20 DIAGNOSIS — F411 Generalized anxiety disorder: Secondary | ICD-10-CM | POA: Diagnosis not present

## 2022-02-20 DIAGNOSIS — E65 Localized adiposity: Secondary | ICD-10-CM | POA: Diagnosis not present

## 2022-02-20 DIAGNOSIS — E663 Overweight: Secondary | ICD-10-CM | POA: Diagnosis not present

## 2022-02-20 DIAGNOSIS — Z6826 Body mass index (BMI) 26.0-26.9, adult: Secondary | ICD-10-CM | POA: Diagnosis not present

## 2022-02-20 DIAGNOSIS — R7303 Prediabetes: Secondary | ICD-10-CM | POA: Diagnosis not present

## 2022-02-20 DIAGNOSIS — Z8546 Personal history of malignant neoplasm of prostate: Secondary | ICD-10-CM | POA: Diagnosis not present

## 2022-02-20 DIAGNOSIS — Z1331 Encounter for screening for depression: Secondary | ICD-10-CM | POA: Diagnosis not present

## 2022-02-20 DIAGNOSIS — K219 Gastro-esophageal reflux disease without esophagitis: Secondary | ICD-10-CM | POA: Diagnosis not present

## 2022-02-20 DIAGNOSIS — E8889 Other specified metabolic disorders: Secondary | ICD-10-CM | POA: Diagnosis not present

## 2022-02-20 DIAGNOSIS — I10 Essential (primary) hypertension: Secondary | ICD-10-CM | POA: Diagnosis not present

## 2022-02-20 DIAGNOSIS — E785 Hyperlipidemia, unspecified: Secondary | ICD-10-CM | POA: Diagnosis not present

## 2022-03-13 DIAGNOSIS — E65 Localized adiposity: Secondary | ICD-10-CM | POA: Diagnosis not present

## 2022-03-13 DIAGNOSIS — R7303 Prediabetes: Secondary | ICD-10-CM | POA: Diagnosis not present

## 2022-03-13 DIAGNOSIS — Z6824 Body mass index (BMI) 24.0-24.9, adult: Secondary | ICD-10-CM | POA: Diagnosis not present

## 2022-03-17 DIAGNOSIS — H4089 Other specified glaucoma: Secondary | ICD-10-CM | POA: Diagnosis not present

## 2022-03-17 DIAGNOSIS — H25813 Combined forms of age-related cataract, bilateral: Secondary | ICD-10-CM | POA: Diagnosis not present

## 2022-03-25 DIAGNOSIS — D485 Neoplasm of uncertain behavior of skin: Secondary | ICD-10-CM | POA: Diagnosis not present

## 2022-03-25 DIAGNOSIS — L821 Other seborrheic keratosis: Secondary | ICD-10-CM | POA: Diagnosis not present

## 2022-03-25 DIAGNOSIS — L570 Actinic keratosis: Secondary | ICD-10-CM | POA: Diagnosis not present

## 2022-03-25 DIAGNOSIS — D2272 Melanocytic nevi of left lower limb, including hip: Secondary | ICD-10-CM | POA: Diagnosis not present

## 2022-03-25 DIAGNOSIS — Z85828 Personal history of other malignant neoplasm of skin: Secondary | ICD-10-CM | POA: Diagnosis not present

## 2022-03-25 DIAGNOSIS — C44529 Squamous cell carcinoma of skin of other part of trunk: Secondary | ICD-10-CM | POA: Diagnosis not present

## 2022-03-31 DIAGNOSIS — E65 Localized adiposity: Secondary | ICD-10-CM | POA: Diagnosis not present

## 2022-03-31 DIAGNOSIS — Z6824 Body mass index (BMI) 24.0-24.9, adult: Secondary | ICD-10-CM | POA: Diagnosis not present

## 2022-03-31 DIAGNOSIS — E785 Hyperlipidemia, unspecified: Secondary | ICD-10-CM | POA: Diagnosis not present

## 2022-03-31 DIAGNOSIS — R7303 Prediabetes: Secondary | ICD-10-CM | POA: Diagnosis not present

## 2022-04-22 DIAGNOSIS — Z6824 Body mass index (BMI) 24.0-24.9, adult: Secondary | ICD-10-CM | POA: Diagnosis not present

## 2022-04-22 DIAGNOSIS — I10 Essential (primary) hypertension: Secondary | ICD-10-CM | POA: Diagnosis not present

## 2022-04-22 DIAGNOSIS — E782 Mixed hyperlipidemia: Secondary | ICD-10-CM | POA: Diagnosis not present

## 2022-04-22 DIAGNOSIS — E65 Localized adiposity: Secondary | ICD-10-CM | POA: Diagnosis not present

## 2022-05-14 DIAGNOSIS — E782 Mixed hyperlipidemia: Secondary | ICD-10-CM | POA: Diagnosis not present

## 2022-05-14 DIAGNOSIS — R7303 Prediabetes: Secondary | ICD-10-CM | POA: Diagnosis not present

## 2022-05-14 DIAGNOSIS — I10 Essential (primary) hypertension: Secondary | ICD-10-CM | POA: Diagnosis not present

## 2022-05-14 DIAGNOSIS — E65 Localized adiposity: Secondary | ICD-10-CM | POA: Diagnosis not present

## 2022-05-14 DIAGNOSIS — Z6824 Body mass index (BMI) 24.0-24.9, adult: Secondary | ICD-10-CM | POA: Diagnosis not present

## 2022-05-14 DIAGNOSIS — Z9189 Other specified personal risk factors, not elsewhere classified: Secondary | ICD-10-CM | POA: Diagnosis not present

## 2022-06-10 DIAGNOSIS — I10 Essential (primary) hypertension: Secondary | ICD-10-CM | POA: Diagnosis not present

## 2022-06-10 DIAGNOSIS — K219 Gastro-esophageal reflux disease without esophagitis: Secondary | ICD-10-CM | POA: Diagnosis not present

## 2022-06-10 DIAGNOSIS — H4089 Other specified glaucoma: Secondary | ICD-10-CM | POA: Diagnosis not present

## 2022-06-10 DIAGNOSIS — Z8546 Personal history of malignant neoplasm of prostate: Secondary | ICD-10-CM | POA: Diagnosis not present

## 2022-06-10 DIAGNOSIS — H25811 Combined forms of age-related cataract, right eye: Secondary | ICD-10-CM | POA: Diagnosis not present

## 2022-06-10 DIAGNOSIS — Z79899 Other long term (current) drug therapy: Secondary | ICD-10-CM | POA: Diagnosis not present

## 2022-06-10 DIAGNOSIS — H2181 Floppy iris syndrome: Secondary | ICD-10-CM | POA: Diagnosis not present

## 2022-06-11 DIAGNOSIS — H25812 Combined forms of age-related cataract, left eye: Secondary | ICD-10-CM | POA: Diagnosis not present

## 2022-06-16 DIAGNOSIS — R7303 Prediabetes: Secondary | ICD-10-CM | POA: Diagnosis not present

## 2022-06-16 DIAGNOSIS — Z6824 Body mass index (BMI) 24.0-24.9, adult: Secondary | ICD-10-CM | POA: Diagnosis not present

## 2022-06-16 DIAGNOSIS — E782 Mixed hyperlipidemia: Secondary | ICD-10-CM | POA: Diagnosis not present

## 2022-06-16 DIAGNOSIS — I10 Essential (primary) hypertension: Secondary | ICD-10-CM | POA: Diagnosis not present

## 2022-07-08 DIAGNOSIS — Z9841 Cataract extraction status, right eye: Secondary | ICD-10-CM | POA: Diagnosis not present

## 2022-07-08 DIAGNOSIS — Z79899 Other long term (current) drug therapy: Secondary | ICD-10-CM | POA: Diagnosis not present

## 2022-07-08 DIAGNOSIS — I10 Essential (primary) hypertension: Secondary | ICD-10-CM | POA: Diagnosis not present

## 2022-07-08 DIAGNOSIS — H4089 Other specified glaucoma: Secondary | ICD-10-CM | POA: Diagnosis not present

## 2022-07-08 DIAGNOSIS — H25812 Combined forms of age-related cataract, left eye: Secondary | ICD-10-CM | POA: Diagnosis not present

## 2022-07-08 DIAGNOSIS — Z961 Presence of intraocular lens: Secondary | ICD-10-CM | POA: Diagnosis not present

## 2022-07-08 DIAGNOSIS — K219 Gastro-esophageal reflux disease without esophagitis: Secondary | ICD-10-CM | POA: Diagnosis not present

## 2022-07-14 ENCOUNTER — Other Ambulatory Visit (HOSPITAL_COMMUNITY): Payer: Self-pay

## 2022-07-14 DIAGNOSIS — Z6825 Body mass index (BMI) 25.0-25.9, adult: Secondary | ICD-10-CM | POA: Diagnosis not present

## 2022-07-14 DIAGNOSIS — R7303 Prediabetes: Secondary | ICD-10-CM | POA: Diagnosis not present

## 2022-07-14 DIAGNOSIS — E782 Mixed hyperlipidemia: Secondary | ICD-10-CM | POA: Diagnosis not present

## 2022-07-14 DIAGNOSIS — I10 Essential (primary) hypertension: Secondary | ICD-10-CM | POA: Diagnosis not present

## 2022-07-15 ENCOUNTER — Other Ambulatory Visit (HOSPITAL_COMMUNITY): Payer: Self-pay

## 2022-07-15 MED ORDER — WEGOVY 0.25 MG/0.5ML ~~LOC~~ SOAJ
0.2500 mg | SUBCUTANEOUS | 0 refills | Status: DC
  Filled 2022-07-15: qty 2, 28d supply, fill #0

## 2022-07-16 ENCOUNTER — Other Ambulatory Visit (HOSPITAL_COMMUNITY): Payer: Self-pay

## 2022-07-28 ENCOUNTER — Other Ambulatory Visit (HOSPITAL_COMMUNITY): Payer: Self-pay

## 2022-08-05 DIAGNOSIS — I10 Essential (primary) hypertension: Secondary | ICD-10-CM | POA: Diagnosis not present

## 2022-08-07 DIAGNOSIS — H43813 Vitreous degeneration, bilateral: Secondary | ICD-10-CM | POA: Diagnosis not present

## 2022-08-07 DIAGNOSIS — H401134 Primary open-angle glaucoma, bilateral, indeterminate stage: Secondary | ICD-10-CM | POA: Diagnosis not present

## 2022-08-07 DIAGNOSIS — H35033 Hypertensive retinopathy, bilateral: Secondary | ICD-10-CM | POA: Diagnosis not present

## 2022-08-07 DIAGNOSIS — H43393 Other vitreous opacities, bilateral: Secondary | ICD-10-CM | POA: Diagnosis not present

## 2022-08-07 DIAGNOSIS — H35373 Puckering of macula, bilateral: Secondary | ICD-10-CM | POA: Diagnosis not present

## 2022-08-11 ENCOUNTER — Other Ambulatory Visit (HOSPITAL_COMMUNITY): Payer: Self-pay

## 2022-08-22 DIAGNOSIS — I10 Essential (primary) hypertension: Secondary | ICD-10-CM | POA: Diagnosis not present

## 2022-08-22 DIAGNOSIS — C61 Malignant neoplasm of prostate: Secondary | ICD-10-CM | POA: Diagnosis not present

## 2022-08-22 DIAGNOSIS — R7303 Prediabetes: Secondary | ICD-10-CM | POA: Diagnosis not present

## 2022-08-22 DIAGNOSIS — E785 Hyperlipidemia, unspecified: Secondary | ICD-10-CM | POA: Diagnosis not present

## 2022-09-02 DIAGNOSIS — R3912 Poor urinary stream: Secondary | ICD-10-CM | POA: Diagnosis not present

## 2022-09-02 DIAGNOSIS — N401 Enlarged prostate with lower urinary tract symptoms: Secondary | ICD-10-CM | POA: Diagnosis not present

## 2022-09-02 DIAGNOSIS — C61 Malignant neoplasm of prostate: Secondary | ICD-10-CM | POA: Diagnosis not present

## 2022-09-03 DIAGNOSIS — E782 Mixed hyperlipidemia: Secondary | ICD-10-CM | POA: Diagnosis not present

## 2022-09-03 DIAGNOSIS — H409 Unspecified glaucoma: Secondary | ICD-10-CM | POA: Diagnosis not present

## 2022-09-03 DIAGNOSIS — I10 Essential (primary) hypertension: Secondary | ICD-10-CM | POA: Diagnosis not present

## 2022-09-03 DIAGNOSIS — E785 Hyperlipidemia, unspecified: Secondary | ICD-10-CM | POA: Diagnosis not present

## 2022-09-03 DIAGNOSIS — R7303 Prediabetes: Secondary | ICD-10-CM | POA: Diagnosis not present

## 2022-09-03 DIAGNOSIS — J3089 Other allergic rhinitis: Secondary | ICD-10-CM | POA: Diagnosis not present

## 2022-09-03 DIAGNOSIS — Z6824 Body mass index (BMI) 24.0-24.9, adult: Secondary | ICD-10-CM | POA: Diagnosis not present

## 2022-09-03 DIAGNOSIS — G479 Sleep disorder, unspecified: Secondary | ICD-10-CM | POA: Diagnosis not present

## 2022-09-03 DIAGNOSIS — Z8546 Personal history of malignant neoplasm of prostate: Secondary | ICD-10-CM | POA: Diagnosis not present

## 2022-09-03 DIAGNOSIS — F411 Generalized anxiety disorder: Secondary | ICD-10-CM | POA: Diagnosis not present

## 2022-09-22 DIAGNOSIS — R7303 Prediabetes: Secondary | ICD-10-CM | POA: Diagnosis not present

## 2022-09-22 DIAGNOSIS — E663 Overweight: Secondary | ICD-10-CM | POA: Diagnosis not present

## 2022-09-22 DIAGNOSIS — E782 Mixed hyperlipidemia: Secondary | ICD-10-CM | POA: Diagnosis not present

## 2022-09-22 DIAGNOSIS — I10 Essential (primary) hypertension: Secondary | ICD-10-CM | POA: Diagnosis not present

## 2022-09-22 DIAGNOSIS — Z6824 Body mass index (BMI) 24.0-24.9, adult: Secondary | ICD-10-CM | POA: Diagnosis not present

## 2022-09-23 DIAGNOSIS — Z1389 Encounter for screening for other disorder: Secondary | ICD-10-CM | POA: Diagnosis not present

## 2022-09-23 DIAGNOSIS — Z0000 Encounter for general adult medical examination without abnormal findings: Secondary | ICD-10-CM | POA: Diagnosis not present

## 2022-09-23 DIAGNOSIS — E663 Overweight: Secondary | ICD-10-CM | POA: Diagnosis not present

## 2022-10-30 DIAGNOSIS — Z6824 Body mass index (BMI) 24.0-24.9, adult: Secondary | ICD-10-CM | POA: Diagnosis not present

## 2022-10-30 DIAGNOSIS — I10 Essential (primary) hypertension: Secondary | ICD-10-CM | POA: Diagnosis not present

## 2022-11-04 DIAGNOSIS — R7303 Prediabetes: Secondary | ICD-10-CM | POA: Diagnosis not present

## 2022-11-04 DIAGNOSIS — I10 Essential (primary) hypertension: Secondary | ICD-10-CM | POA: Diagnosis not present

## 2022-11-04 DIAGNOSIS — Z6823 Body mass index (BMI) 23.0-23.9, adult: Secondary | ICD-10-CM | POA: Diagnosis not present

## 2022-11-04 DIAGNOSIS — E782 Mixed hyperlipidemia: Secondary | ICD-10-CM | POA: Diagnosis not present

## 2022-11-04 DIAGNOSIS — I359 Nonrheumatic aortic valve disorder, unspecified: Secondary | ICD-10-CM | POA: Diagnosis not present

## 2022-11-04 DIAGNOSIS — Z79899 Other long term (current) drug therapy: Secondary | ICD-10-CM | POA: Diagnosis not present

## 2022-12-04 DIAGNOSIS — Z6824 Body mass index (BMI) 24.0-24.9, adult: Secondary | ICD-10-CM | POA: Diagnosis not present

## 2022-12-04 DIAGNOSIS — E782 Mixed hyperlipidemia: Secondary | ICD-10-CM | POA: Diagnosis not present

## 2022-12-04 DIAGNOSIS — R7303 Prediabetes: Secondary | ICD-10-CM | POA: Diagnosis not present

## 2022-12-04 DIAGNOSIS — I10 Essential (primary) hypertension: Secondary | ICD-10-CM | POA: Diagnosis not present

## 2022-12-15 DIAGNOSIS — H4089 Other specified glaucoma: Secondary | ICD-10-CM | POA: Diagnosis not present

## 2022-12-15 DIAGNOSIS — H53453 Other localized visual field defect, bilateral: Secondary | ICD-10-CM | POA: Diagnosis not present

## 2022-12-15 DIAGNOSIS — Z961 Presence of intraocular lens: Secondary | ICD-10-CM | POA: Diagnosis not present

## 2022-12-19 ENCOUNTER — Encounter: Payer: Self-pay | Admitting: Cardiology

## 2022-12-19 ENCOUNTER — Ambulatory Visit: Payer: Medicare Other | Attending: Cardiology | Admitting: Cardiology

## 2022-12-19 VITALS — BP 112/60 | HR 70 | Ht 66.0 in | Wt 150.2 lb

## 2022-12-19 DIAGNOSIS — I1A0 Resistant hypertension: Secondary | ICD-10-CM | POA: Diagnosis not present

## 2022-12-19 DIAGNOSIS — E782 Mixed hyperlipidemia: Secondary | ICD-10-CM

## 2022-12-19 DIAGNOSIS — Z136 Encounter for screening for cardiovascular disorders: Secondary | ICD-10-CM | POA: Diagnosis not present

## 2022-12-19 NOTE — Progress Notes (Unsigned)
Cardiology Office Note:  .   Date:  12/21/2022  ID:  Austin Guerrero, DOB December 22, 1945, MRN [id number] PCP: Gweneth Dimitri, MD  [facility] Providers Cardiologist:  None     Chief Complaint  Patient presents with   New Patient (Initial Visit)    Establish cardiology care for well visit   History of Present Illness: .     Austin Guerrero is a healthy-appearing 77 y.o. male  with a PMH notable for HTN, HLD and Prostate Cancer (diagnosed 8 years ago), as well as bilateral moderate stage glaucoma who presents here for reestablishment of cardiology care at the request of Gweneth Dimitri, MD.  Avonta Fouty was seen in the distant past back in 2015 by Dr. Mayford Knife for abnormal EKG and hypertension.  No follow-up visits since that time.  As noted during that visit that he really only had a family history of mother with pericardial effusion and CHF, and father with hypertension.  No premature CAD.  No cardiac symptoms.  EKG showed possible inferior lateral infarct pattern.  Evaluated with E cho and Myoview.  Both essentially normal.    Subjective  INTERVAL HISTORY Austin Guerrero presents here today for establishment of Cardiology Care to get a new baseline.  He says this been 7-9 years since his last evaluation and just wanted to make sure that things were doing well.  He exercises 5 days a week with 2 days doing light weight resistance training, and 3 days a week doing cardio with recumbent bike.  He has a Research scientist (physical sciences) at [employer] and spends at least an hour a day 5 days a week but also enjoys walking outside with his wife.  He wants to get a good "once over ".  He has not had any problems at all with any chest pain pressure or dyspnea with rest or exertion.  No PND, orthopnea or edema.  No claudication.   His blood pressures have been pretty well-controlled-having recently discontinued amlodipine and converting from losartan to olmesartan along with HCTZ.  His lipids have been relatively  well-controlled on 20 mg atorvastatin which she is tolerating well. He is a non-smoker.  No rapid irregular heartbeats palpitations.  No syncope or near syncope.  No TIA or amaurosis fugax symptoms.  ROS:   Review of Systems - Negative except mild aches and pains in his joints but nothing that limits him.  No melena, hematochezia, hematuria or epistaxis.  Past Medical History:  Diagnosis Date   Allergic rhinitis    GERD (gastroesophageal reflux disease)    Glaucoma    Glaucoma    Hyperlipidemia    Hypertension    Prostate cancer [facility])    Prostate cancer (HCC) 02/21/15   Past Surgical History:  Procedure Laterality Date   EYE SURGERY     laser surgery for glaucoma  2016   FLEXIBLE SIGMOIDOSCOPY N/A 04/21/2017   Procedure: FLEXIBLE SIGMOIDOSCOPY;  Surgeon: Vida Rigger, MD;  Location: [facility] END[facility]COPY;  Service: Endoscopy;  Laterality: N/A;  RFA   INGUINAL HERNIA REPAIR Right 08/27/2015   Procedure: LAPAROSCOPIC RIGHT  INGUINAL HERNIA REPAIR;  Surgeon: Jimmye Norman, MD;  Location: [facility] OR;  Service: General;  Laterality: Right;   INSERTION OF MESH Right 08/27/2015   Procedure: INSERTION OF MESH;  Surgeon: Jimmye Norman, MD;  Location: [facility] OR;  Service: General;  Laterality: Right;   TONSILLECTOMY     VASECTOMY     Social History:  The patient  reports that he has never smoked. He does  not have any smokeless tobacco history on file. He reports that he drinks alcohol. He reports that he does not use illicit drugs.    Family History:  The patient's family history includes Arrhythmia in his father; Atrial fibrillation in his mother; Cancer - Lung in his father; Heart failure in his mother; Hyperlipidemia in his brother; Hypertension in his brother; Prostate cancer in his father.      Objective  Studies Reviewed: Marland Kitchen   EKG Interpretation Date/Time:  Friday December 19 2022 09:55:44 EDT Ventricular Rate:  70 PR Interval:  196 QRS Duration:  94 QT Interval:  414 QTC Calculation: 447 R  Axis:   72  Text Interpretation: Normal sinus rhythm subtle - nondiagnostic/ non-pathologic Q waves in Inf & Lat leads When compared with ECG of 17-Aug-2015 11:33, No significant change was found Confirmed by Bryan Lemma ([id number]) on 12/19/2022 10:07:15 AM   Echo April 10, 2014: EF 55 to 60%.  No RWMA.  GR 1 DD.  Normal valves.  Normal study. Myoview April 06, 2014: Nonischemic.  Normal EF.  Risk Assessment/Calculations:            Physical Exam:   VS:  BP 112/60   Pulse 70   Ht 5\' 6"  (1.676 m)   Wt 150 lb 3.2 oz (68.1 kg)   SpO2 93%   BMI 24.24 kg/m    Wt Readings from Last 3 Encounters:  12/19/22 150 lb 3.2 oz (68.1 kg)  04/21/17 150 lb (68 kg)  08/27/15 150 lb (68 kg)    GEN: ; Healthy-Appearing.  Well-nourished well-groomed.  in no acute distress NECK: No JVD; No carotid bruits CARDIAC: Normal S1, S2; RRR, no murmurs, rubs, gallops RESPIRATORY:  Clear to auscultation without rales, wheezing or rhonchi ; nonlabored, good air movement. ABDOMEN: Soft, non-tender, non-distended EXTREMITIES:  No edema; No deformity   Labs from 08/22/2022: TC 147, TG 59, HDL 57, LDL 78.  A1c 5.7.  Cr 0.87, K+ 4.5.    ASSESSMENT AND PLAN: .    Problem List Items Addressed This Visit       Cardiology Problems   Hypertension - Primary (Chronic)    Well-controlled blood pressure on current dose of olmesartan and hydrochlorothiazide  Continue diet and exercise.  Monitor chemistry panel.      Relevant Medications   olmesartan (BENICAR) 40 MG tablet   Other Relevant Orders   EKG 12-Lead (Completed)   Hyperlipidemia (Chronic)    Labs from 08/22/2022: TC 147, TG 59, HDL 57, LDL 78.  Followed by PCP.  Is on 20 mg atorvastatin.  Pretty much well-controlled for an asymptomatic 77 year old.Very active exerciser with no signs or symptoms of angina, heart failure or claudication.  He would like a baseline cardiovascular valuation, I do not know that a stress test is warranted based on the fact  that he is very active exercising routinely on a daily basis without any cardiac symptoms. I am also not in favor Coronary Calcium Score on a 77 y/o gentleman because 1 would expect him to have a certain amount of coronary calcification it would not necessarily change management.  He is already on statin with well-controlled lipids.  However, I do think a vascular screening test with carotid Dopplers, AAA scan and lower extremity arterial Dopplers is reasonable.    Will check executive panel vascular screen      Relevant Medications   olmesartan (BENICAR) 40 MG tablet   Other Visit Diagnoses     Encounter for screening for  vascular disease       Relevant Orders   VAS US VASCUSCREEN            Dispo: Return in about 1 year (around 12/19/2023) for Routine Follow-up after testing ~ 1-2 months.  Total time spent: 36 min spent with patient + 16 min spent charting = 52 min     Signed, Marykay Lex, MD, MS Bryan Lemma, M.D., M.S. Interventional Cardiologist  [facility]  Pager # [phone] Phone # [phone] [address] [place], [place] [address]

## 2022-12-19 NOTE — Patient Instructions (Addendum)
Medication Instructions:  Your physician recommends that you continue on your current medications as directed. Please refer to the Current Medication list given to you today.  *If you need a refill on your cardiac medications before your next appointment, please call your pharmacy*    Testing/Procedures: All test will be at [address] [facility] [address]. Vascular screening for Carotid Artery Disease, ABD-aortic aneurysm, and Peripheral Artery Disease.  Payment required at time of service of $125.00   Follow-Up: At [facility], you and your health needs are our priority.  As part of our continuing mission to provide you with exceptional heart care, we have created designated Provider Care Teams.  These Care Teams include your primary Cardiologist (physician) and Advanced Practice Providers (APPs -  Physician Assistants and Nurse Practitioners) who all work together to provide you with the care you need, when you need it.  We recommend signing up for the patient portal called "MyChart".  Sign up information is provided on this After Visit Summary.  MyChart is used to connect with patients for Virtual Visits (Telemedicine).  Patients are able to view lab/test results, encounter notes, upcoming appointments, etc.  Non-urgent messages can be sent to your provider as well.   To learn more about what you can do with MyChart, go to [email or web address].    Your next appointment:   1-2 months   Provider:   Dr. Herbie Baltimore

## 2022-12-21 ENCOUNTER — Encounter: Payer: Self-pay | Admitting: Cardiology

## 2022-12-21 NOTE — Assessment & Plan Note (Signed)
Well-controlled blood pressure on current dose of olmesartan and hydrochlorothiazide  Continue diet and exercise.  Monitor chemistry panel.

## 2022-12-21 NOTE — Assessment & Plan Note (Addendum)
Labs from 08/22/2022: TC 147, TG 59, HDL 57, LDL 78.  Followed by PCP.  Is on 20 mg atorvastatin.  Pretty much well-controlled for an asymptomatic 77 year old.Very active exerciser with no signs or symptoms of angina, heart failure or claudication.  He would like a baseline cardiovascular valuation, I do not know that a stress test is warranted based on the fact that he is very active exercising routinely on a daily basis without any cardiac symptoms. I am also not in favor Coronary Calcium Score on a 77 y/o gentleman because 1 would expect him to have a certain amount of coronary calcification it would not necessarily change management.  He is already on statin with well-controlled lipids.  However, I do think a vascular screening test with carotid Dopplers, AAA scan and lower extremity arterial Dopplers is reasonable.    Will check executive panel vascular screen

## 2022-12-23 ENCOUNTER — Ambulatory Visit (HOSPITAL_COMMUNITY)
Admission: RE | Admit: 2022-12-23 | Discharge: 2022-12-23 | Disposition: A | Discharge status: Home / Self Care | Payer: Medicare Other | Source: Ambulatory Visit | Attending: Cardiology | Admitting: Cardiology

## 2022-12-23 DIAGNOSIS — Z136 Encounter for screening for cardiovascular disorders: Secondary | ICD-10-CM

## 2022-12-24 ENCOUNTER — Encounter (HOSPITAL_COMMUNITY): Payer: Self-pay | Admitting: Cardiology

## 2022-12-24 DIAGNOSIS — H4089 Other specified glaucoma: Secondary | ICD-10-CM | POA: Diagnosis not present

## 2022-12-24 DIAGNOSIS — Z961 Presence of intraocular lens: Secondary | ICD-10-CM | POA: Diagnosis not present

## 2022-12-24 DIAGNOSIS — H53453 Other localized visual field defect, bilateral: Secondary | ICD-10-CM | POA: Diagnosis not present

## 2022-12-26 ENCOUNTER — Other Ambulatory Visit (HOSPITAL_COMMUNITY): Payer: Self-pay | Admitting: Family Medicine

## 2022-12-26 ENCOUNTER — Encounter: Payer: Self-pay | Admitting: Cardiology

## 2022-12-26 DIAGNOSIS — H5340 Unspecified visual field defects: Secondary | ICD-10-CM

## 2022-12-27 NOTE — Telephone Encounter (Signed)
I do not think it is necessary if he is okay with that.  We can just simply see him back in a year if he wants

## 2022-12-29 ENCOUNTER — Ambulatory Visit (HOSPITAL_COMMUNITY)
Admission: RE | Admit: 2022-12-29 | Discharge: 2022-12-29 | Disposition: A | Discharge status: Home / Self Care | Payer: Medicare Other | Source: Ambulatory Visit | Attending: Family Medicine | Admitting: Family Medicine

## 2022-12-29 DIAGNOSIS — R9082 White matter disease, unspecified: Secondary | ICD-10-CM | POA: Diagnosis not present

## 2022-12-29 DIAGNOSIS — H5340 Unspecified visual field defects: Secondary | ICD-10-CM | POA: Insufficient documentation

## 2023-01-22 DIAGNOSIS — Z6825 Body mass index (BMI) 25.0-25.9, adult: Secondary | ICD-10-CM | POA: Diagnosis not present

## 2023-01-22 DIAGNOSIS — K4091 Unilateral inguinal hernia, without obstruction or gangrene, recurrent: Secondary | ICD-10-CM | POA: Diagnosis not present

## 2023-01-22 DIAGNOSIS — Z23 Encounter for immunization: Secondary | ICD-10-CM | POA: Diagnosis not present

## 2023-01-28 DIAGNOSIS — Z6824 Body mass index (BMI) 24.0-24.9, adult: Secondary | ICD-10-CM | POA: Diagnosis not present

## 2023-01-28 DIAGNOSIS — R632 Polyphagia: Secondary | ICD-10-CM | POA: Diagnosis not present

## 2023-01-28 DIAGNOSIS — E65 Localized adiposity: Secondary | ICD-10-CM | POA: Diagnosis not present

## 2023-01-28 DIAGNOSIS — E8881 Metabolic syndrome: Secondary | ICD-10-CM | POA: Diagnosis not present

## 2023-01-29 DIAGNOSIS — Z85828 Personal history of other malignant neoplasm of skin: Secondary | ICD-10-CM | POA: Diagnosis not present

## 2023-01-29 DIAGNOSIS — L570 Actinic keratosis: Secondary | ICD-10-CM | POA: Diagnosis not present

## 2023-01-29 DIAGNOSIS — D225 Melanocytic nevi of trunk: Secondary | ICD-10-CM | POA: Diagnosis not present

## 2023-01-29 DIAGNOSIS — L820 Inflamed seborrheic keratosis: Secondary | ICD-10-CM | POA: Diagnosis not present

## 2023-01-29 DIAGNOSIS — D171 Benign lipomatous neoplasm of skin and subcutaneous tissue of trunk: Secondary | ICD-10-CM | POA: Diagnosis not present

## 2023-01-29 DIAGNOSIS — L821 Other seborrheic keratosis: Secondary | ICD-10-CM | POA: Diagnosis not present

## 2023-02-04 ENCOUNTER — Ambulatory Visit: Payer: Self-pay | Admitting: Surgery

## 2023-02-04 DIAGNOSIS — K4090 Unilateral inguinal hernia, without obstruction or gangrene, not specified as recurrent: Secondary | ICD-10-CM | POA: Diagnosis not present

## 2023-02-04 NOTE — H&P (View-Only) (Signed)
Austin Guerrero [phone]   Referring Provider:  Pam Drown, MD   Subjective   Chief Complaint: New Consultation ( Unilateral recurrent inguinal hernia)     History of Present Illness:    Very pleasant 77 year old male with history of GERD, glaucoma, hypertension, hyperlipidemia and prostate cancer presents for evaluation of an inguinal hernia.  He has a history of a laparoscopic right inguinal hernia repair with mesh by Dr. Lindie Spruce in 2017.  He noticed a protrusion in the left groin a couple months ago and has been managing it with a hernia belt and manual reduction, which she ends up having to do 3-4 times a day.  He has been refraining from any lifting since first noticing the hernia. No significant pain, no associated GI or urinary symptoms.   Review of Systems: A complete review of systems was obtained from the patient.  I have reviewed this information and discussed as appropriate with the patient.  See HPI as well for other ROS.   Medical History: Past Medical History:  Diagnosis Date   Glaucoma (increased eye pressure)    High cholesterol    Hypertension    Prostate cancer (CMS/HHS-HCC)    Vision abnormalities     Patient Active Problem List  Diagnosis   Moderate stage glaucoma of both eyes due to combination of mechanisms   Nuclear sclerotic cataract of both eyes   Epiretinal membrane (ERM) of both eyes   Cortical age-related cataract of both eyes   Trichiasis of left lower eyelid without entropion    Past Surgical History:  Procedure Laterality Date   GLAUCOMA EYE SURGERY Bilateral 2016   LPI OU w/ Dr. Georgianne Fick   GLAUCOMA EYE SURGERY Left 02/12/2017   SLT with Dr. Loraine Grip   GLAUCOMA EYE SURGERY Right 05/28/2017   SLT w/Dr.Moya    GLAUCOMA EYE SURGERY Left 04/15/2018   SLT w/Dr.Moya    CORRECTION TRICHIASIS BY EPILATION  06/20/2019   GLAUCOMA EYE SURGERY Left 06/08/2020   SLT w/Dr.Moya    REPAIR FEMORAL HERNIA     TONSILLECTOMY       No Known  Allergies  Current Outpatient Medications on File Prior to Visit  Medication Sig Dispense Refill   amLODIPine (NORVASC) 5 MG tablet 1 tablet     ascorbic acid, vitamin C, (VITAMIN C) 500 MG tablet Take by mouth.     aspirin 81 MG EC tablet Take 81 mg by mouth once daily     atorvastatin (LIPITOR) 20 MG tablet Take 40 mg by mouth once daily       calcium carbonate-vit D3-min 600 mg calcium- 400 unit Tab Take 1 tablet by mouth once daily.       desloratadine (CLARINEX) 5 mg tablet Take 1 tablet by mouth once daily     dorzolamide-timoloL (COSOPT) 22.3-6.8 mg/mL ophthalmic solution Place 1 drop into both eyes 2 (two) times daily 30 mL 4   escitalopram oxalate (LEXAPRO) 20 MG tablet      hydroCHLOROthiazide (HYDRODIURIL) 25 MG tablet Take 25 mg by mouth once daily.       ketorolac (ACULAR) 0.5 % ophthalmic solution Insert one drop 4 times a day into surgical eye starting 4 days prior to surgery 5 mL 0   latanoprost (XALATAN) 0.005 % ophthalmic solution Place 1 drop into both eyes at bedtime 10 mL 3   losartan (COZAAR) 25 MG tablet 100 mg     moxifloxacin (VIGAMOX) 0.5 % ophthalmic solution Insert one drop into the surgical eye 3  times a day starting 4 days prior to surgery. 1 mL 0   multivitamin tablet Take 1 tablet by mouth once daily.       olmesartan (BENICAR) 40 MG tablet Take 40 mg by mouth once daily     pantoprazole (PROTONIX) 20 MG DR tablet Take 20 mg by mouth once daily.       prednisoLONE acetate (PRED FORTE) 1 % ophthalmic suspension One drop 4 times a day x 2 weeks, then 3 times a day x 2 weeks, then  twice daily x 2 weeks then once a day x 2 weeks, then stop. 5 mL 0   tamsulosin (FLOMAX) 0.4 mg capsule      zaleplon (SONATA) 10 MG capsule TAKE 1 CAPSULE BY MOUTH ONCE A DAY FOR MID-NIGHT AWAKENS AS NEEDED     brimonidine (ALPHAGAN) 0.2 % ophthalmic solution Place 1 drop into both eyes 2 (two) times daily for 90 days 30 mL 3   No current facility-administered medications on file  prior to visit.    Family History  Problem Relation Age of Onset   Heart disease Mother    Prostate cancer Father    Cataracts Father      Social History   Tobacco Use  Smoking Status Never  Smokeless Tobacco Never     Social History   Socioeconomic History   Marital status: Married  Tobacco Use   Smoking status: Never   Smokeless tobacco: Never  Vaping Use   Vaping status: Never Used  Substance and Sexual Activity   Alcohol use: Yes   Drug use: Never   Social Determinants of Health    Received from [facility]   Social Network    Objective:    Vitals:   02/04/23 1426  BP: (!) 162/73  Pulse: 75  Temp: 36.5 C (97.7 F)  SpO2: 99%  Weight: 67.9 kg (149 lb 9.6 oz)  Height: 167.6 cm (5\' 6" )  PainSc: 0-No pain    Body mass index is 24.15 kg/m.  Gen: A&Ox3, no distress  Unlabored respirations Reducible left inguinal hernia, small to moderate.  No hernia recurrence on the right.  Assessment and Plan:  Diagnoses and all orders for this visit:  Non-recurrent unilateral inguinal hernia without obstruction or gangrene    We discussed the relevant anatomy and we discussed options for repair.  I recommend an open approach and went over the technique of the procedure.  Reviewed risks of bleeding, infection, pain, scarring, injury to structures in the area including nerves, blood vessels, bowel, bladder, risk of chronic pain, hernia recurrence, risk of seroma or hematoma, urinary retention, and risks of general anesthesia including cardiovascular, pulmonary, and thromboembolic complications.  We discussed typical postop recovery, timeline, and activity limitations.  We also discussed the option of ongoing observation, with high rate of ultimately returning for surgery and risk of increasing size/symptoms from the hernia as well as incarceration/strangulation and went over symptoms that should prompt him to seek emergency treatment.  Questions were come  and answered to the patient's satisfaction. Patient wishes to proceed with scheduling.   Curtis Uriarte Carlye Grippe, MD

## 2023-02-04 NOTE — H&P (Signed)
Austin Guerrero [phone]   Referring Provider:  Pam Drown, MD   Subjective   Chief Complaint: New Consultation ( Unilateral recurrent inguinal hernia)     History of Present Illness:    Very pleasant 77 year old male with history of GERD, glaucoma, hypertension, hyperlipidemia and prostate cancer presents for evaluation of an inguinal hernia.  He has a history of a laparoscopic right inguinal hernia repair with mesh by Dr. Lindie Spruce in 2017.  He noticed a protrusion in the left groin a couple months ago and has been managing it with a hernia belt and manual reduction, which she ends up having to do 3-4 times a day.  He has been refraining from any lifting since first noticing the hernia. No significant pain, no associated GI or urinary symptoms.   Review of Systems: A complete review of systems was obtained from the patient.  I have reviewed this information and discussed as appropriate with the patient.  See HPI as well for other ROS.   Medical History: Past Medical History:  Diagnosis Date   Glaucoma (increased eye pressure)    High cholesterol    Hypertension    Prostate cancer (CMS/HHS-HCC)    Vision abnormalities     Patient Active Problem List  Diagnosis   Moderate stage glaucoma of both eyes due to combination of mechanisms   Nuclear sclerotic cataract of both eyes   Epiretinal membrane (ERM) of both eyes   Cortical age-related cataract of both eyes   Trichiasis of left lower eyelid without entropion    Past Surgical History:  Procedure Laterality Date   GLAUCOMA EYE SURGERY Bilateral 2016   LPI OU w/ Dr. Georgianne Fick   GLAUCOMA EYE SURGERY Left 02/12/2017   SLT with Dr. Loraine Grip   GLAUCOMA EYE SURGERY Right 05/28/2017   SLT w/Dr.Moya    GLAUCOMA EYE SURGERY Left 04/15/2018   SLT w/Dr.Moya    CORRECTION TRICHIASIS BY EPILATION  06/20/2019   GLAUCOMA EYE SURGERY Left 06/08/2020   SLT w/Dr.Moya    REPAIR FEMORAL HERNIA     TONSILLECTOMY       No Known  Allergies  Current Outpatient Medications on File Prior to Visit  Medication Sig Dispense Refill   amLODIPine (NORVASC) 5 MG tablet 1 tablet     ascorbic acid, vitamin C, (VITAMIN C) 500 MG tablet Take by mouth.     aspirin 81 MG EC tablet Take 81 mg by mouth once daily     atorvastatin (LIPITOR) 20 MG tablet Take 40 mg by mouth once daily       calcium carbonate-vit D3-min 600 mg calcium- 400 unit Tab Take 1 tablet by mouth once daily.       desloratadine (CLARINEX) 5 mg tablet Take 1 tablet by mouth once daily     dorzolamide-timoloL (COSOPT) 22.3-6.8 mg/mL ophthalmic solution Place 1 drop into both eyes 2 (two) times daily 30 mL 4   escitalopram oxalate (LEXAPRO) 20 MG tablet      hydroCHLOROthiazide (HYDRODIURIL) 25 MG tablet Take 25 mg by mouth once daily.       ketorolac (ACULAR) 0.5 % ophthalmic solution Insert one drop 4 times a day into surgical eye starting 4 days prior to surgery 5 mL 0   latanoprost (XALATAN) 0.005 % ophthalmic solution Place 1 drop into both eyes at bedtime 10 mL 3   losartan (COZAAR) 25 MG tablet 100 mg     moxifloxacin (VIGAMOX) 0.5 % ophthalmic solution Insert one drop into the surgical eye 3  times a day starting 4 days prior to surgery. 1 mL 0   multivitamin tablet Take 1 tablet by mouth once daily.       olmesartan (BENICAR) 40 MG tablet Take 40 mg by mouth once daily     pantoprazole (PROTONIX) 20 MG DR tablet Take 20 mg by mouth once daily.       prednisoLONE acetate (PRED FORTE) 1 % ophthalmic suspension One drop 4 times a day x 2 weeks, then 3 times a day x 2 weeks, then  twice daily x 2 weeks then once a day x 2 weeks, then stop. 5 mL 0   tamsulosin (FLOMAX) 0.4 mg capsule      zaleplon (SONATA) 10 MG capsule TAKE 1 CAPSULE BY MOUTH ONCE A DAY FOR MID-NIGHT AWAKENS AS NEEDED     brimonidine (ALPHAGAN) 0.2 % ophthalmic solution Place 1 drop into both eyes 2 (two) times daily for 90 days 30 mL 3   No current facility-administered medications on file  prior to visit.    Family History  Problem Relation Age of Onset   Heart disease Mother    Prostate cancer Father    Cataracts Father      Social History   Tobacco Use  Smoking Status Never  Smokeless Tobacco Never     Social History   Socioeconomic History   Marital status: Married  Tobacco Use   Smoking status: Never   Smokeless tobacco: Never  Vaping Use   Vaping status: Never Used  Substance and Sexual Activity   Alcohol use: Yes   Drug use: Never   Social Determinants of Health    Received from [facility], [facility]   Social Network    Objective:    Vitals:   02/04/23 1426  BP: (!) 162/73  Pulse: 75  Temp: 36.5 C (97.7 F)  SpO2: 99%  Weight: 67.9 kg (149 lb 9.6 oz)  Height: 167.6 cm (5\' 6" )  PainSc: 0-No pain    Body mass index is 24.15 kg/m.  Gen: A&Ox3, no distress  Unlabored respirations Reducible left inguinal hernia, small to moderate.  No hernia recurrence on the right.  Assessment and Plan:  Diagnoses and all orders for this visit:  Non-recurrent unilateral inguinal hernia without obstruction or gangrene    We discussed the relevant anatomy and we discussed options for repair.  I recommend an open approach and went over the technique of the procedure.  Reviewed risks of bleeding, infection, pain, scarring, injury to structures in the area including nerves, blood vessels, bowel, bladder, risk of chronic pain, hernia recurrence, risk of seroma or hematoma, urinary retention, and risks of general anesthesia including cardiovascular, pulmonary, and thromboembolic complications.  We discussed typical postop recovery, timeline, and activity limitations.  We also discussed the option of ongoing observation, with high rate of ultimately returning for surgery and risk of increasing size/symptoms from the hernia as well as incarceration/strangulation and went over symptoms that should prompt him to seek emergency treatment.  Questions were come  and answered to the patient's satisfaction. Patient wishes to proceed with scheduling.   Kristene Liberati Carlye Grippe, MD

## 2023-02-05 NOTE — Progress Notes (Addendum)
PCP - DR. Selena Batten [place] [facility] Cardiologist - Bryan Lemma ,MD Theron Arista 12-21-22 epic  PPM/ICD -  Device Orders -  Rep Notified -   Chest x-ray -  EKG - 12-19-22 epic Stress Test - 2015 ECHO - 2015 Cardiac Cath -   Sleep Study -  CPAP -   Fasting Blood Sugar -  Checks Blood Sugar _____ times a day  Blood Thinner Instructions: Aspirin Instructions:  ERAS Protcol - PRE-SURGERY N/A   Ozempic none since 01-31-23 pt. Aware to hold next dose 02-08-23 COVID vaccine -yes  Activity--Able to complete a flight of stairs without CP or SOB  Anesthesia review: HTN, BP 97/54 at preop.  Shanda Bumps Ward PA-C aware . Pt.  Instructed to keep a check on at home . Pt. Asymptomatic states his MD is aware and discontinued Amlodipine, will not be taking ARB or hydrochlorothiazide prior to surgery.  Patient denies shortness of breath, fever, cough and chest pain at PAT appointment   All instructions explained to the patient, with a verbal understanding of the material. Patient agrees to go over the instructions while at home for a better understanding. Patient also instructed to self quarantine after being tested for COVID-19. The opportunity to ask questions was provided.

## 2023-02-05 NOTE — Patient Instructions (Addendum)
SURGICAL WAITING ROOM VISITATION  Patients having surgery or a procedure may have no more than 2 support people in the waiting area - these visitors may rotate.    Children under the age of 2 must have an adult with them who is not the patient.  If the patient needs to stay at the hospital during part of their recovery, the visitor guidelines for inpatient rooms apply. Pre-op nurse will coordinate an appropriate time for 1 support person to accompany patient in pre-op.  This support person may not rotate.    Please refer to the [facility] website for the visitor guidelines for Inpatients (after your surgery is over and you are in a regular room).       Your procedure is scheduled on: 02-16-23   Report to [facility] Main Entrance    Report to admitting at       11:15 AM   Call this number if you have problems the morning of surgery [phone]   Do not eat food :After Midnight.   After Midnight you may have the following liquids until __0730____ AM  DAY OF SURGERY   then nothing by mouth  Water Non-Citrus Juices (without pulp, NO RED-Apple, White grape, White cranberry) Black Coffee (NO MILK/CREAM OR CREAMERS, sugar ok)  Clear Tea (NO MILK/CREAM OR CREAMERS, sugar ok) regular and decaf                             Plain Jell-O (NO RED)                                           Fruit ices (not with fruit pulp, NO RED)                                     Popsicles (NO RED)                                                               Sports drinks like Gatorade (NO RED)                             If you have questions, please contact your surgeon's office.   FOLLOW AND ANY ADDITIONAL PRE OP INSTRUCTIONS YOU RECEIVED FROM YOUR SURGEON'S OFFICE!!!     Oral Hygiene is also important to reduce your risk of infection.                                    Remember - BRUSH YOUR TEETH THE MORNING OF SURGERY WITH YOUR REGULAR TOOTHPASTE  DENTURES WILL BE REMOVED PRIOR TO  SURGERY PLEASE DO NOT APPLY "Poly grip" OR ADHESIVES!!!   Do NOT smoke after Midnight   Stop all vitamins and herbal supplements 7 days before surgery.   Take these medicines the morning of surgery with A SIP OF WATER: pantoprazole, escitalopram, eye drops as usual, atorvastatin.  OZEMPIC HOLD ONE WEEK NONE AFTER 02-08-23                 You may not have any metal on your body including hair pins, jewelry, and body piercing             Do not wear  lotions, powders, /cologne, or deodorant                Men may shave face and neck.   Do not bring valuables to the hospital. [facility] IS NOT             RESPONSIBLE   FOR VALUABLES.   Contacts, glasses, dentures or bridgework may not be worn into surgery.   Bring small overnight bag day of surgery.   DO NOT BRING YOUR HOME MEDICATIONS TO THE HOSPITAL. PHARMACY WILL DISPENSE MEDICATIONS LISTED ON YOUR MEDICATION LIST TO YOU DURING YOUR ADMISSION IN THE HOSPITAL!    Patients discharged on the day of surgery will not be allowed to drive home.  Someone NEEDS to stay with you for the first 24 hours after anesthesia.   Special Instructions: Bring a copy of your healthcare power of attorney and living will documents the day of surgery if you haven't scanned them before.              Please read over the following fact sheets you were given: IF YOU HAVE QUESTIONS ABOUT YOUR PRE-OP INSTRUCTIONS PLEASE CALL [phone]    If you test positive for Covid or have been in contact with anyone that has tested positive in the last 10 days please notify you surgeon.    [facility] - Preparing for Surgery Before surgery, you can play an important role.  Because skin is not sterile, your skin needs to be as free of germs as possible.  You can reduce the number of germs on your skin by washing with CHG (chlorahexidine gluconate) soap before surgery.  CHG is an antiseptic cleaner which kills germs and bonds with the skin to continue  killing germs even after washing. Please DO NOT use if you have an allergy to CHG or antibacterial soaps.  If your skin becomes reddened/irritated stop using the CHG and inform your nurse when you arrive at Short Stay. Do not shave (including legs and underarms) for at least 48 hours prior to the first CHG shower.  You may shave your face/neck. Please follow these instructions carefully:  1.  Shower with CHG Soap the night before surgery and the  morning of Surgery.  2.  If you choose to wash your hair, wash your hair first as usual with your  normal  shampoo.  3.  After you shampoo, rinse your hair and body thoroughly to remove the  shampoo.                           4.  Use CHG as you would any other liquid soap.  You can apply chg directly  to the skin and wash                       Gently with a scrungie or clean washcloth.  5.  Apply the CHG Soap to your body ONLY FROM THE NECK DOWN.   Do not use on face/ open  Wound or open sores. Avoid contact with eyes, ears mouth and genitals (private parts).                       Wash face,  Genitals (private parts) with your normal soap.             6.  Wash thoroughly, paying special attention to the area where your surgery  will be performed.  7.  Thoroughly rinse your body with warm water from the neck down.  8.  DO NOT shower/wash with your normal soap after using and rinsing off  the CHG Soap.                9.  Pat yourself dry with a clean towel.            10.  Wear clean pajamas.            11.  Place clean sheets on your bed the night of your first shower and do not  sleep with pets. Day of Surgery : Do not apply any lotions/deodorants the morning of surgery.  Please wear clean clothes to the hospital/surgery center.  FAILURE TO FOLLOW THESE INSTRUCTIONS MAY RESULT IN THE CANCELLATION OF YOUR SURGERY PATIENT SIGNATURE_________________________________  NURSE  SIGNATURE__________________________________  ________________________________________________________________________

## 2023-02-06 ENCOUNTER — Encounter (HOSPITAL_COMMUNITY)
Admission: RE | Admit: 2023-02-06 | Discharge: 2023-02-06 | Disposition: A | Discharge status: Home / Self Care | Payer: Medicare Other | Source: Ambulatory Visit | Attending: Surgery | Admitting: Surgery

## 2023-02-06 ENCOUNTER — Other Ambulatory Visit: Payer: Self-pay

## 2023-02-06 ENCOUNTER — Encounter (HOSPITAL_COMMUNITY): Payer: Self-pay

## 2023-02-06 VITALS — BP 97/54 | HR 68 | Temp 97.9°F | Resp 18 | Ht 66.0 in | Wt 146.0 lb

## 2023-02-06 DIAGNOSIS — Z01812 Encounter for preprocedural laboratory examination: Secondary | ICD-10-CM | POA: Diagnosis not present

## 2023-02-06 DIAGNOSIS — I10 Essential (primary) hypertension: Secondary | ICD-10-CM | POA: Diagnosis not present

## 2023-02-06 HISTORY — DX: Anxiety disorder, unspecified: F41.9

## 2023-02-06 LAB — BASIC METABOLIC PANEL
Anion gap: 9 (ref 5–15)
BUN: 22 mg/dL (ref 8–23)
CO2: 27 mmol/L (ref 22–32)
Calcium: 9 mg/dL (ref 8.9–10.3)
Chloride: 98 mmol/L (ref 98–111)
Creatinine, Ser: 0.89 mg/dL (ref 0.61–1.24)
GFR, Estimated: >60 mL/min (ref >60)
Glucose, Bld: 97 mg/dL (ref 70–99)
Potassium: 4 mmol/L (ref 3.5–5.1)
Sodium: 134 mmol/L — ABNORMAL LOW (ref 135–145)

## 2023-02-06 LAB — CBC
HCT: 36.4 % — ABNORMAL LOW (ref 39.0–52.0)
Hemoglobin: 12.4 g/dL — ABNORMAL LOW (ref 13.0–17.0)
MCH: 33.2 pg (ref 26.0–34.0)
MCHC: 34.1 g/dL (ref 30.0–36.0)
MCV: 97.6 fL (ref 80.0–100.0)
Platelets: 294 10*3/uL (ref 150–400)
RBC: 3.73 MIL/uL — ABNORMAL LOW (ref 4.22–5.81)
RDW: 12.1 % (ref 11.5–15.5)
WBC: 5.4 10*3/uL (ref 4.0–10.5)
nRBC: 0 % (ref 0.0–0.2)

## 2023-02-16 ENCOUNTER — Ambulatory Visit (HOSPITAL_COMMUNITY)
Admission: RE | Admit: 2023-02-16 | Discharge: 2023-02-16 | Disposition: A | Discharge status: Home / Self Care | Payer: Medicare Other | Attending: Surgery | Admitting: Surgery

## 2023-02-16 ENCOUNTER — Ambulatory Visit (HOSPITAL_COMMUNITY): Payer: Medicare Other | Admitting: Anesthesiology

## 2023-02-16 ENCOUNTER — Encounter (HOSPITAL_COMMUNITY)
Admission: RE | Disposition: A | Discharge status: Home / Self Care | Payer: Self-pay | Source: Home / Self Care | Attending: Surgery

## 2023-02-16 ENCOUNTER — Ambulatory Visit (HOSPITAL_BASED_OUTPATIENT_CLINIC_OR_DEPARTMENT_OTHER): Payer: Medicare Other | Admitting: Anesthesiology

## 2023-02-16 ENCOUNTER — Other Ambulatory Visit: Payer: Self-pay

## 2023-02-16 ENCOUNTER — Encounter (HOSPITAL_COMMUNITY): Payer: Self-pay | Admitting: Surgery

## 2023-02-16 DIAGNOSIS — K4090 Unilateral inguinal hernia, without obstruction or gangrene, not specified as recurrent: Secondary | ICD-10-CM | POA: Diagnosis not present

## 2023-02-16 DIAGNOSIS — K219 Gastro-esophageal reflux disease without esophagitis: Secondary | ICD-10-CM | POA: Diagnosis not present

## 2023-02-16 DIAGNOSIS — I10 Essential (primary) hypertension: Secondary | ICD-10-CM | POA: Diagnosis not present

## 2023-02-16 DIAGNOSIS — F419 Anxiety disorder, unspecified: Secondary | ICD-10-CM | POA: Diagnosis not present

## 2023-02-16 DIAGNOSIS — E785 Hyperlipidemia, unspecified: Secondary | ICD-10-CM | POA: Diagnosis not present

## 2023-02-16 DIAGNOSIS — E1139 Type 2 diabetes mellitus with other diabetic ophthalmic complication: Secondary | ICD-10-CM | POA: Diagnosis not present

## 2023-02-16 DIAGNOSIS — H42 Glaucoma in diseases classified elsewhere: Secondary | ICD-10-CM | POA: Diagnosis not present

## 2023-02-16 DIAGNOSIS — Z8546 Personal history of malignant neoplasm of prostate: Secondary | ICD-10-CM | POA: Insufficient documentation

## 2023-02-16 HISTORY — PX: INGUINAL HERNIA REPAIR: SHX194

## 2023-02-16 SURGERY — REPAIR, HERNIA, INGUINAL, ADULT
Anesthesia: General | Site: Abdomen | Laterality: Left

## 2023-02-16 MED ORDER — BUPIVACAINE LIPOSOME 1.3 % IJ SUSP: 20.0000 mL | Freq: Once | INTRAMUSCULAR | Status: DC

## 2023-02-16 MED ORDER — DEXAMETHASONE SODIUM PHOSPHATE 10 MG/ML IJ SOLN
INTRAMUSCULAR | Status: AC
  Filled 2023-02-16: qty 1

## 2023-02-16 MED ORDER — BUPIVACAINE-EPINEPHRINE 0.25% -1:200000 IJ SOLN
INTRAMUSCULAR | Status: AC
  Filled 2023-02-16: qty 1

## 2023-02-16 MED ORDER — CHLORHEXIDINE GLUCONATE 4 % EX SOLN: 60.0000 mL | Freq: Once | CUTANEOUS | Status: DC

## 2023-02-16 MED ORDER — ONDANSETRON HCL 4 MG/2ML IJ SOLN
INTRAMUSCULAR | Status: DC | PRN
  Administered 2023-02-16: 4 mg via INTRAVENOUS

## 2023-02-16 MED ORDER — BUPIVACAINE LIPOSOME 1.3 % IJ SUSP
INTRAMUSCULAR | Status: AC
  Filled 2023-02-16: qty 20

## 2023-02-16 MED ORDER — OXYCODONE HCL 5 MG/5ML PO SOLN: 5.0000 mg | Freq: Once | ORAL | Status: AC | PRN

## 2023-02-16 MED ORDER — MEPERIDINE HCL 50 MG/ML IJ SOLN: 6.2500 mg | INTRAMUSCULAR | Status: DC | PRN

## 2023-02-16 MED ORDER — GABAPENTIN 300 MG PO CAPS
300.0000 mg | ORAL_CAPSULE | ORAL | Status: AC
  Administered 2023-02-16: 300 mg via ORAL
  Filled 2023-02-16: qty 1

## 2023-02-16 MED ORDER — CEFAZOLIN SODIUM-DEXTROSE 2-4 GM/100ML-% IV SOLN
2.0000 g | INTRAVENOUS | Status: AC
  Administered 2023-02-16: 2 g via INTRAVENOUS
  Filled 2023-02-16: qty 100

## 2023-02-16 MED ORDER — FENTANYL CITRATE (PF) 100 MCG/2ML IJ SOLN
INTRAMUSCULAR | Status: AC
  Filled 2023-02-16: qty 2

## 2023-02-16 MED ORDER — DEXAMETHASONE SODIUM PHOSPHATE 10 MG/ML IJ SOLN
INTRAMUSCULAR | Status: DC | PRN
Start: 2023-02-16 — End: 2023-02-16
  Administered 2023-02-16: 10 mg via INTRAVENOUS

## 2023-02-16 MED ORDER — ROCURONIUM BROMIDE 10 MG/ML (PF) SYRINGE
PREFILLED_SYRINGE | INTRAVENOUS | Status: AC
  Filled 2023-02-16: qty 10

## 2023-02-16 MED ORDER — ROCURONIUM BROMIDE 100 MG/10ML IV SOLN
INTRAVENOUS | Status: DC | PRN
  Administered 2023-02-16: 50 mg via INTRAVENOUS

## 2023-02-16 MED ORDER — HYDROMORPHONE HCL 1 MG/ML IJ SOLN: 0.2500 mg | INTRAMUSCULAR | Status: DC | PRN

## 2023-02-16 MED ORDER — DOCUSATE SODIUM 100 MG PO CAPS: 100.0000 mg | ORAL_CAPSULE | Freq: Two times a day (BID) | ORAL | 0 refills | Status: AC

## 2023-02-16 MED ORDER — OXYCODONE HCL 5 MG PO TABS
ORAL_TABLET | ORAL | Status: AC
  Administered 2023-02-16: 5 mg via ORAL
  Filled 2023-02-16: qty 1

## 2023-02-16 MED ORDER — MIDAZOLAM HCL 5 MG/5ML IJ SOLN
INTRAMUSCULAR | Status: DC | PRN
  Administered 2023-02-16 (×2): 1 mg via INTRAVENOUS

## 2023-02-16 MED ORDER — GLYCOPYRROLATE 0.2 MG/ML IJ SOLN
INTRAMUSCULAR | Status: DC | PRN
Start: 2023-02-16 — End: 2023-02-16
  Administered 2023-02-16: .2 mg via INTRAVENOUS

## 2023-02-16 MED ORDER — FENTANYL CITRATE (PF) 100 MCG/2ML IJ SOLN
INTRAMUSCULAR | Status: DC | PRN
  Administered 2023-02-16: 25 ug via INTRAVENOUS
  Administered 2023-02-16: 50 ug via INTRAVENOUS
  Administered 2023-02-16: 25 ug via INTRAVENOUS

## 2023-02-16 MED ORDER — BUPIVACAINE LIPOSOME 1.3 % IJ SUSP
INTRAMUSCULAR | Status: DC | PRN
  Administered 2023-02-16: 20 mL

## 2023-02-16 MED ORDER — LACTATED RINGERS IV SOLN: INTRAVENOUS | Status: DC

## 2023-02-16 MED ORDER — MIDAZOLAM HCL 2 MG/2ML IJ SOLN: 0.5000 mg | Freq: Once | INTRAMUSCULAR | Status: DC | PRN

## 2023-02-16 MED ORDER — PROPOFOL 10 MG/ML IV BOLUS
INTRAVENOUS | Status: DC | PRN
  Administered 2023-02-16: 120 mg via INTRAVENOUS

## 2023-02-16 MED ORDER — SUGAMMADEX SODIUM 200 MG/2ML IV SOLN
INTRAVENOUS | Status: DC | PRN
Start: 2023-02-16 — End: 2023-02-16
  Administered 2023-02-16: 200 mg via INTRAVENOUS

## 2023-02-16 MED ORDER — EPHEDRINE 5 MG/ML INJ
INTRAVENOUS | Status: AC
  Filled 2023-02-16: qty 5

## 2023-02-16 MED ORDER — OXYCODONE HCL 5 MG PO TABS
5.0000 mg | ORAL_TABLET | Freq: Three times a day (TID) | ORAL | 0 refills | Status: AC | PRN
Start: 2023-02-16 — End: 2023-02-21

## 2023-02-16 MED ORDER — ONDANSETRON HCL 4 MG/2ML IJ SOLN
INTRAMUSCULAR | Status: AC
  Filled 2023-02-16: qty 2

## 2023-02-16 MED ORDER — ORAL CARE MOUTH RINSE: 15.0000 mL | Freq: Once | OROMUCOSAL | Status: AC

## 2023-02-16 MED ORDER — CHLORHEXIDINE GLUCONATE 0.12 % MT SOLN
15.0000 mL | Freq: Once | OROMUCOSAL | Status: AC
  Administered 2023-02-16: 15 mL via OROMUCOSAL

## 2023-02-16 MED ORDER — LIDOCAINE HCL (CARDIAC) PF 100 MG/5ML IV SOSY
PREFILLED_SYRINGE | INTRAVENOUS | Status: DC | PRN
  Administered 2023-02-16: 40 mg via INTRAVENOUS

## 2023-02-16 MED ORDER — PROPOFOL 10 MG/ML IV BOLUS
INTRAVENOUS | Status: AC
  Filled 2023-02-16: qty 20

## 2023-02-16 MED ORDER — BUPIVACAINE-EPINEPHRINE 0.25% -1:200000 IJ SOLN
INTRAMUSCULAR | Status: DC | PRN
  Administered 2023-02-16: 30 mL

## 2023-02-16 MED ORDER — EPHEDRINE SULFATE (PRESSORS) 50 MG/ML IJ SOLN
INTRAMUSCULAR | Status: DC | PRN
  Administered 2023-02-16: 7.5 mg via INTRAVENOUS

## 2023-02-16 MED ORDER — MIDAZOLAM HCL 2 MG/2ML IJ SOLN
INTRAMUSCULAR | Status: AC
  Filled 2023-02-16: qty 2

## 2023-02-16 MED ORDER — OXYCODONE HCL 5 MG PO TABS: 5.0000 mg | ORAL_TABLET | Freq: Once | ORAL | Status: AC | PRN

## 2023-02-16 MED ORDER — ACETAMINOPHEN 500 MG PO TABS
1000.0000 mg | ORAL_TABLET | ORAL | Status: AC
  Administered 2023-02-16: 1000 mg via ORAL
  Filled 2023-02-16: qty 2

## 2023-02-16 SURGICAL SUPPLY — 37 items
APL PRP STRL LF DISP 70% ISPRP (MISCELLANEOUS) ×1
APL SKNCLS STERI-STRIP NONHPOA (GAUZE/BANDAGES/DRESSINGS) ×1
BAG COUNTER SPONGE SURGICOUNT (BAG) IMPLANT
BAG SPNG CNTER NS LX DISP (BAG)
BENZOIN TINCTURE PRP APPL 2/3 (GAUZE/BANDAGES/DRESSINGS) ×1 IMPLANT
BLADE SURG 15 STRL LF DISP TIS (BLADE) ×1 IMPLANT
BLADE SURG 15 STRL SS (BLADE) ×1
CHLORAPREP W/TINT 26 (MISCELLANEOUS) ×1 IMPLANT
CLSR STERI-STRIP ANTIMIC 1/2X4 (GAUZE/BANDAGES/DRESSINGS) IMPLANT
COVER SURGICAL LIGHT HANDLE (MISCELLANEOUS) ×1 IMPLANT
DRAIN PENROSE 0.5X18 (DRAIN) ×1 IMPLANT
DRAPE LAPAROSCOPIC ABDOMINAL (DRAPES) ×1 IMPLANT
ELECT REM PT RETURN 15FT ADLT (MISCELLANEOUS) ×1 IMPLANT
GAUZE SPONGE 4X4 12PLY STRL (GAUZE/BANDAGES/DRESSINGS) IMPLANT
GLOVE BIO SURGEON STRL SZ 6 (GLOVE) ×1 IMPLANT
GLOVE INDICATOR 6.5 STRL GRN (GLOVE) ×1 IMPLANT
GOWN STRL REUS W/ TWL LRG LVL3 (GOWN DISPOSABLE) ×1 IMPLANT
GOWN STRL REUS W/TWL LRG LVL3 (GOWN DISPOSABLE) ×1
KIT BASIN OR (CUSTOM PROCEDURE TRAY) ×1 IMPLANT
KIT TURNOVER KIT A (KITS) IMPLANT
MARKER SKIN DUAL TIP RULER LAB (MISCELLANEOUS) ×1 IMPLANT
MESH ULTRAPRO 3X6 7.6X15CM (Mesh General) IMPLANT
NDL HYPO 22X1.5 SAFETY MO (MISCELLANEOUS) ×1 IMPLANT
NEEDLE HYPO 22X1.5 SAFETY MO (MISCELLANEOUS) ×1
PACK GENERAL/GYN (CUSTOM PROCEDURE TRAY) ×1 IMPLANT
SPIKE FLUID TRANSFER (MISCELLANEOUS) ×1 IMPLANT
STRIP CLOSURE SKIN 1/2X4 (GAUZE/BANDAGES/DRESSINGS) ×1 IMPLANT
SUT ETHIBOND 0 MO6 C/R (SUTURE) ×1 IMPLANT
SUT MNCRL AB 4-0 PS2 18 (SUTURE) ×1 IMPLANT
SUT PDS AB 0 CT1 36 (SUTURE) ×2 IMPLANT
SUT VIC AB 3-0 SH 27 (SUTURE) ×2
SUT VIC AB 3-0 SH 27XBRD (SUTURE) ×2 IMPLANT
SUT VICRYL 3 0 BR 18 UND (SUTURE) ×1 IMPLANT
SYR CONTROL 10ML LL (SYRINGE) ×1 IMPLANT
TAPE PAPER 3X10 WHT MICROPORE (GAUZE/BANDAGES/DRESSINGS) IMPLANT
TOWEL OR 17X26 10 PK STRL BLUE (TOWEL DISPOSABLE) ×1 IMPLANT
TOWEL OR NON WOVEN STRL DISP B (DISPOSABLE) ×1 IMPLANT

## 2023-02-16 NOTE — Transfer of Care (Signed)
Immediate Anesthesia Transfer of Care Note  Patient: Austin Guerrero  Procedure(s) Performed: OPEN LEFT INGUINAL HERNIA REPAIR WITH MESH (Left: Abdomen)  Patient Location: PACU  Anesthesia Type:General  Level of Consciousness: awake, alert , oriented, and drowsy  Airway & Oxygen Therapy: Patient Spontanous Breathing and Patient connected to face mask oxygen  Post-op Assessment: Report given to RN, Post -op Vital signs reviewed and stable, and Patient moving all extremities  Post vital signs: Reviewed and stable  Last Vitals:  Vitals Value Taken Time  BP    Temp    Pulse 60 02/16/23 1422  Resp 13 02/16/23 1422  SpO2 100 % 02/16/23 1422  Vitals shown include unfiled device data.  Last Pain:  Vitals:   02/16/23 1141  TempSrc:   PainSc: 0-No pain         Complications: No notable events documented.

## 2023-02-16 NOTE — Anesthesia Procedure Notes (Signed)
Procedure Name: Intubation Date/Time: 02/16/2023 1:02 PM  Performed by: Chinita Pester, CRNAPre-anesthesia Checklist: Patient identified, Emergency Drugs available, Suction available and Patient being monitored Patient Re-evaluated:Patient Re-evaluated prior to induction Oxygen Delivery Method: Circle System Utilized Preoxygenation: Pre-oxygenation with 100% oxygen Induction Type: IV induction Ventilation: Mask ventilation without difficulty Laryngoscope Size: 4 and Mac Grade View: Grade III Tube type: Oral Tube size: 7.5 mm Number of attempts: 1 Airway Equipment and Method: Stylet and Oral airway Placement Confirmation: ETT inserted through vocal cords under direct vision, positive ETCO2 and breath sounds checked- equal and bilateral Secured at: 22 cm Tube secured with: Tape Dental Injury: Teeth and Oropharynx as per pre-operative assessment

## 2023-02-16 NOTE — Op Note (Signed)
Operative Note  Austin Guerrero  [id number]  [id number]  02/16/2023   Surgeon: Berna Bue MD FACS   Procedure performed: Open left inguinal hernia repair with mesh   Preop diagnosis:  left inguinal hernia   Post-op diagnosis/intraop findings: indirect inguinal hernia   Specimens: none   EBL: 5cc   Complications: none   Description of procedure: After confirming informed consent, the patient was taken to the operating room and placed supine on the operating room table where general anesthesia was initiated, preoperative antibiotics were administered, SCDs applied, and a formal timeout was performed. The groin was clipped, prepped and draped in the usual sterile fashion. An oblique incision was made the just above the inguinal ligament after infiltrating the tissues with local anesthetic 9exparel mixed with 0.25% marcaine with epinephrine). Soft tissues were dissected using electrocautery until the external oblique aponeurosis was encountered. This was divided sharply to expand the external ring. A plane was bluntly developed between the spermatic cord and the external oblique. The ilioinguinal nerve was divided between hemostats and each end ligated with 3-0 vicryl ties. The spermatic cord was then bluntly dissected away from the pubic tubercle and encircled with a Penrose. Inspection of the inguinal anatomy revealed a moderate indirect hernia sac with a sliding component of colon and disrupted inguinal floor. The indirect hernia sac was bluntly dissected away from the cord structures and skeletonized to the level of the internal ring, where it was reduced intact into the abdomen.  The inguinal floor was reconstructed suturing the conjoint tendon to the inguinal ligament with interrupted 0 PDS, leaving an internal ring just sufficient for the cord structures. A 3 x 6 piece of ultra Pro mesh was brought onto the field and trimmed to approximate the field. This was sutured to the pubic tubercle  fascia, inferior shelving edge and to the internal oblique superiorly with interrupted 0 ethibonds. The tails of the mesh were wrapped around the spermatic cord, ensuring adequate room for the cord, and sutured to each other with 0 ethibond, and then directed laterally to lie flat beneath the external oblique aponeurosis. An additional suture was placed medially to reinforce the slit in the mesh. Hemostasis was ensured within the wound. The Penrose was removed. The external oblique aponeurosis was reapproximated with a running 3-0 Vicryl to re-create a narrowed external ring. More local was infiltrated around the pubic tubercle and in the plane just below the external oblique. The Scarpa's was reapproximated with interrupted 3-0 Vicryls. The skin was closed with a running subcuticular 4-0 Monocryl. The remainder of the local was injected in the subcutaneous and subcuticular space. The field was then cleaned, benzoin and Steri-Strips and sterile bandage were applied. The patient was then awakened extubated and taken to PACU in stable condition.    All counts were correct at the completion of the case

## 2023-02-16 NOTE — Discharge Instructions (Addendum)
HERNIA REPAIR: POST OP INSTRUCTIONS   EAT Gradually transition to a high fiber diet with a fiber supplement over the next few weeks after discharge.  Start with a pureed / full liquid diet (see below)  WALK Walk an hour a day (cumulative- not all at once).  Control your pain to do that.    CONTROL PAIN Control pain so that you can walk, sleep, tolerate sneezing/coughing, and go up/down stairs.  HAVE A BOWEL MOVEMENT DAILY Keep your bowels regular to avoid problems.  OK to try a laxative to override constipation.  OK to use an antidiarrheal to slow down diarrhea.  Call if not better after 2 tries  CALL IF YOU HAVE PROBLEMS/CONCERNS Call if you are still struggling despite following these instructions. Call if you have concerns not answered by these instructions  ######################################################################    DIET: Follow a light bland diet & liquids the first 24 hours after arrival home, such as soup, liquids, starches, etc.  Be sure to drink plenty of fluids.  Quickly advance to a usual solid diet within a few days.  Avoid fast food or heavy meals initially as you are more likely to get nauseated or have irregular bowels.   Take your usually prescribed home medications unless otherwise directed.  PAIN CONTROL: Pain is best controlled by a usual combination of three different methods TOGETHER: Ice/Heat Over the counter pain medication Prescription pain medication Most patients will experience some swelling and bruising around the hernia(s) such as the bellybutton, groins, or old incisions.  Ice packs or heating pads (30-60 minutes up to 6 times a day) will help. Use ice for the first few days to help decrease swelling and bruising, then switch to heat to help relax tight/sore spots and speed recovery.  Some people prefer to use ice alone, heat alone, alternating between ice & heat.  Experiment to what works for you.  Swelling and bruising can take several  weeks to resolve.   It is helpful to take an over-the-counter pain medication regularly for the first days: Naproxen (Aleve, etc)  Two 220mg  tabs twice a day OR Ibuprofen (Advil, etc) Three 200mg  tabs four times a day (every meal & bedtime) AND Acetaminophen (Tylenol, etc) 325-650mg  four times a day (every meal & bedtime) A  prescription for pain medication should be given to you upon discharge.  Take your pain medication as prescribed, IF NEEDED.  If you are having problems/concerns with the prescription medicine (does not control pain, nausea, vomiting, rash, itching, etc), please call us [phone] to see if we need to switch you to a different pain medicine that will work better for you and/or control your side effect better. If you need a refill on your pain medication, please contact your pharmacy.  They will contact our office to request authorization. Prescriptions will not be filled after 5 pm or on week-ends.  Avoid getting constipated.  Between the surgery and the pain medications, it is common to experience some constipation.  Increasing fluid intake and taking a fiber supplement (such as Metamucil, Citrucel, FiberCon, MiraLax, etc) 1-2 times a day regularly will usually help prevent this problem from occurring.  A mild laxative (prune juice, Milk of Magnesia, MiraLax, etc) should be taken according to package directions if there are no bowel movements after 48 hours.    Wash / shower every day, starting 2 days after surgery.  You may shower over the steri strips which are waterproof.  No rubbing, scrubbing, lotions or ointments  to incision. Do not soak or submerge.   Remove your outer bandage 2 days after surgery. Steri strips (white tapes) will peel off after 1-2 weeks. You may leave the incision open to air.  You may replace a dressing/Band-Aid to cover an incision for comfort if you wish.  Continue to shower over incision(s) after the dressing is off.  ACTIVITIES as tolerated:    You may resume regular (light) daily activities beginning the next day--such as daily self-care, walking, climbing stairs--gradually increasing activities as tolerated.  Control your pain so that you can walk an hour a day.  If you can walk 30 minutes without difficulty, it is safe to try more intense activity such as jogging, treadmill, bicycling, low-impact aerobics, swimming, etc. Refrain from the most intensive and strenuous activity such as sit-ups, heavy lifting, contact sports, etc  Refrain from any heavy lifting or straining until 6 weeks after surgery.   DO NOT PUSH THROUGH PAIN.  Let pain be your guide: If it hurts to do something, don't do it.  Pain is your body warning you to avoid that activity for another week until the pain goes down. You may drive when you are no longer taking prescription pain medication, you can comfortably wear a seatbelt, and you can safely maneuver your car and apply brakes. You may have sexual intercourse when it is comfortable.   FOLLOW UP in our office Please call [facility] at [phone] to set up an appointment to see your surgeon in the office for a follow-up appointment approximately 2-3 weeks after your surgery. Make sure that you call for this appointment the day you arrive home to insure a convenient appointment time.  9.  If you have disability of FMLA / Family leave forms, please bring the forms to the office for processing.  (do not give to your surgeon).  WHEN TO CALL us [phone]: Poor pain control Reactions / problems with new medications (rash/itching, nausea, etc)  Fever over 101.5 F (38.5 C) Inability to urinate Nausea and/or vomiting Worsening swelling or bruising Continued bleeding from incision. Increased pain, redness, or drainage from the incision   The clinic staff is available to answer your questions during regular business hours (8:30am-5pm).  Please don't hesitate to call and ask to speak to one of our nurses for clinical  concerns.   If you have a medical emergency, go to the nearest emergency room or call 911.  A surgeon from [facility] is always on call at the hospitals in [facility], [place] [address], [place], [place]  [address] ?  [address] [address], [place], [place]   [address] MAIN: [phone] ? TOLL FREE: [phone] ? FAX: [phone] [email or web address]

## 2023-02-16 NOTE — Anesthesia Preprocedure Evaluation (Addendum)
Anesthesia Evaluation  Patient identified by MRN, date of birth, ID band Patient awake    Reviewed: Allergy & Precautions, NPO status , Patient's Chart, lab work & pertinent test results  Airway Mallampati: II  TM Distance: >3 FB Neck ROM: Full    Dental  (+) Teeth Intact, Dental Advisory Given   Pulmonary neg pulmonary ROS   breath sounds clear to auscultation       Cardiovascular hypertension, Pt. on medications  Rhythm:Regular Rate:Normal     Neuro/Psych   Anxiety     negative neurological ROS     GI/Hepatic Neg liver ROS,GERD  ,,  Endo/Other  diabetes    Renal/GU negative Renal ROS     Musculoskeletal negative musculoskeletal ROS (+)    Abdominal   Peds  Hematology negative hematology ROS (+)   Anesthesia Other Findings - HLD  Reproductive/Obstetrics                             Anesthesia Physical Anesthesia Plan  ASA: 2  Anesthesia Plan: General   Post-op Pain Management: Tylenol PO (pre-op)* and Gabapentin PO (pre-op)*   Induction: Intravenous  PONV Risk Score and Plan: 3 and Ondansetron and Midazolam  Airway Management Planned: Oral ETT  Additional Equipment: None  Intra-op Plan:   Post-operative Plan: Extubation in OR  Informed Consent: I have reviewed the patients History and Physical, chart, labs and discussed the procedure including the risks, benefits and alternatives for the proposed anesthesia with the patient or authorized representative who has indicated his/her understanding and acceptance.     Dental advisory given  Plan Discussed with: CRNA  Anesthesia Plan Comments:        Anesthesia Quick Evaluation

## 2023-02-16 NOTE — Interval H&P Note (Signed)
History and Physical Interval Note:  02/16/2023 12:19 PM  Austin Guerrero  has presented today for surgery, with the diagnosis of INGUINAL HERNIA.  The various methods of treatment have been discussed with the patient and family. After consideration of risks, benefits and other options for treatment, the patient has consented to  Procedure(s): OPEN LEFT INGUINAL HERNIA REPAIR WITH MESH (Left) as a surgical intervention.  The patient's history has been reviewed, patient examined, no change in status, stable for surgery.  I have reviewed the patient's chart and labs.  Questions were answered to the patient's satisfaction.     Ardythe Klute Lollie Sails

## 2023-02-17 ENCOUNTER — Encounter (HOSPITAL_COMMUNITY): Payer: Self-pay | Admitting: Surgery

## 2023-02-17 NOTE — Anesthesia Postprocedure Evaluation (Signed)
Anesthesia Post Note  Patient: Austin Guerrero  Procedure(s) Performed: OPEN LEFT INGUINAL HERNIA REPAIR WITH MESH (Left: Abdomen)     Patient location during evaluation: PACU Anesthesia Type: General Level of consciousness: awake and alert Pain management: pain level controlled Vital Signs Assessment: post-procedure vital signs reviewed and stable Respiratory status: spontaneous breathing, nonlabored ventilation, respiratory function stable and patient connected to nasal cannula oxygen Cardiovascular status: blood pressure returned to baseline and stable Postop Assessment: no apparent nausea or vomiting Anesthetic complications: no   No notable events documented.               Shelton Silvas

## 2023-02-20 ENCOUNTER — Telehealth: Payer: Federal, State, Local not specified - PPO | Admitting: Cardiology

## 2023-02-25 DIAGNOSIS — R7303 Prediabetes: Secondary | ICD-10-CM | POA: Diagnosis not present

## 2023-02-25 DIAGNOSIS — Z6823 Body mass index (BMI) 23.0-23.9, adult: Secondary | ICD-10-CM | POA: Diagnosis not present

## 2023-02-25 DIAGNOSIS — E782 Mixed hyperlipidemia: Secondary | ICD-10-CM | POA: Diagnosis not present

## 2023-02-25 DIAGNOSIS — I10 Essential (primary) hypertension: Secondary | ICD-10-CM | POA: Diagnosis not present

## 2023-02-25 DIAGNOSIS — E65 Localized adiposity: Secondary | ICD-10-CM | POA: Diagnosis not present

## 2023-03-16 DIAGNOSIS — R7309 Other abnormal glucose: Secondary | ICD-10-CM | POA: Diagnosis not present

## 2023-03-16 DIAGNOSIS — R7303 Prediabetes: Secondary | ICD-10-CM | POA: Diagnosis not present

## 2023-03-23 DIAGNOSIS — G479 Sleep disorder, unspecified: Secondary | ICD-10-CM | POA: Diagnosis not present

## 2023-03-23 DIAGNOSIS — R7303 Prediabetes: Secondary | ICD-10-CM | POA: Diagnosis not present

## 2023-03-23 DIAGNOSIS — I10 Essential (primary) hypertension: Secondary | ICD-10-CM | POA: Diagnosis not present

## 2023-03-23 DIAGNOSIS — Z6824 Body mass index (BMI) 24.0-24.9, adult: Secondary | ICD-10-CM | POA: Diagnosis not present

## 2023-03-23 DIAGNOSIS — E65 Localized adiposity: Secondary | ICD-10-CM | POA: Diagnosis not present

## 2023-03-23 DIAGNOSIS — C61 Malignant neoplasm of prostate: Secondary | ICD-10-CM | POA: Diagnosis not present

## 2023-03-23 DIAGNOSIS — F411 Generalized anxiety disorder: Secondary | ICD-10-CM | POA: Diagnosis not present

## 2023-03-23 DIAGNOSIS — E782 Mixed hyperlipidemia: Secondary | ICD-10-CM | POA: Diagnosis not present

## 2023-03-23 DIAGNOSIS — E8881 Metabolic syndrome: Secondary | ICD-10-CM | POA: Diagnosis not present

## 2023-03-23 DIAGNOSIS — H9193 Unspecified hearing loss, bilateral: Secondary | ICD-10-CM | POA: Diagnosis not present

## 2023-03-23 DIAGNOSIS — J3089 Other allergic rhinitis: Secondary | ICD-10-CM | POA: Diagnosis not present

## 2023-03-23 DIAGNOSIS — H409 Unspecified glaucoma: Secondary | ICD-10-CM | POA: Diagnosis not present

## 2023-03-31 DIAGNOSIS — Z6823 Body mass index (BMI) 23.0-23.9, adult: Secondary | ICD-10-CM | POA: Diagnosis not present

## 2023-03-31 DIAGNOSIS — R7303 Prediabetes: Secondary | ICD-10-CM | POA: Diagnosis not present

## 2023-03-31 DIAGNOSIS — Z1331 Encounter for screening for depression: Secondary | ICD-10-CM | POA: Diagnosis not present

## 2023-03-31 DIAGNOSIS — I10 Essential (primary) hypertension: Secondary | ICD-10-CM | POA: Diagnosis not present

## 2023-03-31 DIAGNOSIS — E782 Mixed hyperlipidemia: Secondary | ICD-10-CM | POA: Diagnosis not present

## 2023-04-27 DIAGNOSIS — I10 Essential (primary) hypertension: Secondary | ICD-10-CM | POA: Diagnosis not present

## 2023-04-27 DIAGNOSIS — Z6823 Body mass index (BMI) 23.0-23.9, adult: Secondary | ICD-10-CM | POA: Diagnosis not present

## 2023-04-27 DIAGNOSIS — R7303 Prediabetes: Secondary | ICD-10-CM | POA: Diagnosis not present

## 2023-04-27 DIAGNOSIS — E65 Localized adiposity: Secondary | ICD-10-CM | POA: Diagnosis not present

## 2023-04-27 DIAGNOSIS — E782 Mixed hyperlipidemia: Secondary | ICD-10-CM | POA: Diagnosis not present

## 2023-06-05 DIAGNOSIS — R7303 Prediabetes: Secondary | ICD-10-CM | POA: Diagnosis not present

## 2023-06-05 DIAGNOSIS — Z6823 Body mass index (BMI) 23.0-23.9, adult: Secondary | ICD-10-CM | POA: Diagnosis not present

## 2023-06-05 DIAGNOSIS — E782 Mixed hyperlipidemia: Secondary | ICD-10-CM | POA: Diagnosis not present

## 2023-06-05 DIAGNOSIS — E65 Localized adiposity: Secondary | ICD-10-CM | POA: Diagnosis not present

## 2023-06-05 DIAGNOSIS — I10 Essential (primary) hypertension: Secondary | ICD-10-CM | POA: Diagnosis not present

## 2023-06-29 ENCOUNTER — Other Ambulatory Visit (HOSPITAL_COMMUNITY): Payer: Self-pay

## 2023-06-29 MED ORDER — ZALEPLON 10 MG PO CAPS
10.0000 mg | ORAL_CAPSULE | Freq: Every day | ORAL | 0 refills | Status: DC | PRN
  Filled 2023-06-29: qty 30, 30d supply, fill #0

## 2023-06-30 ENCOUNTER — Other Ambulatory Visit (HOSPITAL_COMMUNITY): Payer: Self-pay

## 2023-07-01 ENCOUNTER — Other Ambulatory Visit (HOSPITAL_COMMUNITY): Payer: Self-pay

## 2023-07-03 DIAGNOSIS — E782 Mixed hyperlipidemia: Secondary | ICD-10-CM | POA: Diagnosis not present

## 2023-07-03 DIAGNOSIS — Z6823 Body mass index (BMI) 23.0-23.9, adult: Secondary | ICD-10-CM | POA: Diagnosis not present

## 2023-07-03 DIAGNOSIS — E65 Localized adiposity: Secondary | ICD-10-CM | POA: Diagnosis not present

## 2023-07-03 DIAGNOSIS — I10 Essential (primary) hypertension: Secondary | ICD-10-CM | POA: Diagnosis not present

## 2023-07-03 DIAGNOSIS — R7303 Prediabetes: Secondary | ICD-10-CM | POA: Diagnosis not present

## 2023-08-11 DIAGNOSIS — Z6823 Body mass index (BMI) 23.0-23.9, adult: Secondary | ICD-10-CM | POA: Diagnosis not present

## 2023-08-11 DIAGNOSIS — E65 Localized adiposity: Secondary | ICD-10-CM | POA: Diagnosis not present

## 2023-08-11 DIAGNOSIS — R7303 Prediabetes: Secondary | ICD-10-CM | POA: Diagnosis not present

## 2023-08-11 DIAGNOSIS — I10 Essential (primary) hypertension: Secondary | ICD-10-CM | POA: Diagnosis not present

## 2023-08-11 DIAGNOSIS — E782 Mixed hyperlipidemia: Secondary | ICD-10-CM | POA: Diagnosis not present

## 2023-08-25 DIAGNOSIS — C61 Malignant neoplasm of prostate: Secondary | ICD-10-CM | POA: Diagnosis not present

## 2023-09-01 DIAGNOSIS — C61 Malignant neoplasm of prostate: Secondary | ICD-10-CM | POA: Diagnosis not present

## 2023-09-01 DIAGNOSIS — N401 Enlarged prostate with lower urinary tract symptoms: Secondary | ICD-10-CM | POA: Diagnosis not present

## 2023-09-01 DIAGNOSIS — R3912 Poor urinary stream: Secondary | ICD-10-CM | POA: Diagnosis not present

## 2023-09-07 DIAGNOSIS — R7303 Prediabetes: Secondary | ICD-10-CM | POA: Diagnosis not present

## 2023-09-07 DIAGNOSIS — I10 Essential (primary) hypertension: Secondary | ICD-10-CM | POA: Diagnosis not present

## 2023-09-07 DIAGNOSIS — E782 Mixed hyperlipidemia: Secondary | ICD-10-CM | POA: Diagnosis not present

## 2023-09-07 DIAGNOSIS — Z6823 Body mass index (BMI) 23.0-23.9, adult: Secondary | ICD-10-CM | POA: Diagnosis not present

## 2023-09-07 DIAGNOSIS — E65 Localized adiposity: Secondary | ICD-10-CM | POA: Diagnosis not present

## 2023-09-26 ENCOUNTER — Ambulatory Visit: Payer: Self-pay | Admitting: Internal Medicine

## 2023-09-26 ENCOUNTER — Ambulatory Visit (HOSPITAL_BASED_OUTPATIENT_CLINIC_OR_DEPARTMENT_OTHER)
Admission: RE | Admit: 2023-09-26 | Discharge: 2023-09-26 | Disposition: A | Discharge status: Home / Self Care | Source: Ambulatory Visit | Attending: Internal Medicine | Admitting: Internal Medicine

## 2023-09-26 ENCOUNTER — Ambulatory Visit
Admission: RE | Admit: 2023-09-26 | Discharge: 2023-09-26 | Disposition: A | Discharge status: Home / Self Care | Source: Ambulatory Visit | Attending: Internal Medicine

## 2023-09-26 VITALS — BP 142/72 | HR 57 | Temp 97.8°F | Resp 17

## 2023-09-26 DIAGNOSIS — R0981 Nasal congestion: Secondary | ICD-10-CM | POA: Diagnosis not present

## 2023-09-26 DIAGNOSIS — J209 Acute bronchitis, unspecified: Secondary | ICD-10-CM

## 2023-09-26 DIAGNOSIS — R0602 Shortness of breath: Secondary | ICD-10-CM | POA: Diagnosis not present

## 2023-09-26 DIAGNOSIS — I700 Atherosclerosis of aorta: Secondary | ICD-10-CM | POA: Diagnosis not present

## 2023-09-26 DIAGNOSIS — R059 Cough, unspecified: Secondary | ICD-10-CM | POA: Insufficient documentation

## 2023-09-26 DIAGNOSIS — R0989 Other specified symptoms and signs involving the circulatory and respiratory systems: Secondary | ICD-10-CM | POA: Diagnosis not present

## 2023-09-26 MED ORDER — BENZONATATE 100 MG PO CAPS: 100.0000 mg | ORAL_CAPSULE | Freq: Three times a day (TID) | ORAL | 0 refills | Status: AC

## 2023-09-26 MED ORDER — ALBUTEROL SULFATE HFA 108 (90 BASE) MCG/ACT IN AERS
2.0000 | INHALATION_SPRAY | Freq: Once | RESPIRATORY_TRACT | Status: AC
  Administered 2023-09-26: 2 via RESPIRATORY_TRACT

## 2023-09-26 MED ORDER — PREDNISONE 20 MG PO TABS: 40.0000 mg | ORAL_TABLET | Freq: Every day | ORAL | 0 refills | Status: AC

## 2023-09-26 MED ORDER — METHYLPREDNISOLONE SODIUM SUCC 125 MG IJ SOLR
80.0000 mg | Freq: Once | INTRAMUSCULAR | Status: AC
  Administered 2023-09-26: 80 mg via INTRAMUSCULAR

## 2023-09-26 MED ORDER — IPRATROPIUM-ALBUTEROL 0.5-2.5 (3) MG/3ML IN SOLN
3.0000 mL | Freq: Once | RESPIRATORY_TRACT | Status: AC
  Administered 2023-09-26: 3 mL via RESPIRATORY_TRACT

## 2023-09-26 MED ORDER — AEROCHAMBER PLUS FLO-VU MEDIUM MISC
1.0000 | Freq: Once | Status: AC
  Administered 2023-09-26: 1

## 2023-09-26 NOTE — Discharge Instructions (Addendum)
 Please go to med [facility] and have x-ray performed. Do not check in to the ER. Go to imaging department, get images performed, then go home. You will receive a phone call if the x-ray shows any abnormal results requiring further treatment. If the x-ray results do not change our treatment plan, you will not receive a phone call.  Also see these results on MyChart.  [facility] [address] [place], [place]  You have bronchitis which is inflammation of the upper airways in your lungs due to a virus.   We will treat this with steroids. Do not take any NSAIDs with steroid pills (no ibuprofen, naproxen while taking steroid, this could cause stomach upset).   Use albuterol 2 puffs every 4-6 hours on a schedule for the next 24 hours, then as needed for cough, shortness of breath, and wheezing.   Use guaifenesin (plain mucinex) to break up congestion in nose/chest so that you are able to excrete easier. Drink plenty of fluids to stay well hydrated while taking mucinex so that it works well in the body.   Tessalon  perles every 8 hours as needed for cough.  If you develop any new or worsening symptoms or if your symptoms do not start to improve, please return here or follow-up with your primary care provider. If your symptoms are severe, please go to the emergency room.

## 2023-09-26 NOTE — ED Provider Notes (Signed)
 [facility]    CSN: [id number] Arrival date & time: 09/26/23  0844      History   Chief Complaint Chief Complaint  Patient presents with   Cough    Two weeks now. Congestion - Entered by patient    HPI Austin Guerrero is a 78 y.o. male.   Austin Guerrero is a 78 y.o. male presenting for chief complaint of cough and congestion that started 2 weeks ago. His wife was sick with similar symptoms at the same time and is now better. Cough is productive with clear/yellow sputum. Reports associated intermittent shortness of breath with coughing. Denies chest pain, N/V/D, abdominal pain, rash, fever, chills, leg swelling, orthopnea, and dizziness. Never smoker, no history of asthma/copd. Denies recent antibiotic/steroid use. He has been taking mucinex with some relief to break up secretions but states cough is persistent and he can hear wheezing to the chest.    Cough   Past Medical History:  Diagnosis Date   Allergic rhinitis    Anxiety    GERD (gastroesophageal reflux disease)    Glaucoma    Glaucoma    Hyperlipidemia    Hypertension    Prostate cancer (HCC)    Prostate cancer (HCC) 02/21/2015    Patient Active Problem List   Diagnosis Date Noted   Prostate cancer (HCC)    Hypertension    Hyperlipidemia    GERD (gastroesophageal reflux disease)    Allergic rhinitis    Glaucoma     Past Surgical History:  Procedure Laterality Date   EYE SURGERY     laser surgery for glaucoma  2016   FLEXIBLE SIGMOIDOSCOPY N/A 04/21/2017   Procedure: FLEXIBLE SIGMOIDOSCOPY;  Surgeon: Ozell Blunt, MD;  Location: [facility];  Service: Endoscopy;  Laterality: N/A;  RFA   INGUINAL HERNIA REPAIR Right 08/27/2015   Procedure: LAPAROSCOPIC RIGHT  INGUINAL HERNIA REPAIR;  Surgeon: Jerryl Morin, MD;  Location: [facility] OR;  Service: General;  Laterality: Right;   INGUINAL HERNIA REPAIR Left 02/16/2023   Procedure: OPEN LEFT INGUINAL HERNIA REPAIR WITH MESH;  Surgeon: Adalberto Acton, MD;   Location: [facility] ORS;  Service: General;  Laterality: Left;   INSERTION OF MESH Right 08/27/2015   Procedure: INSERTION OF MESH;  Surgeon: Jerryl Morin, MD;  Location: [facility] OR;  Service: General;  Laterality: Right;   TONSILLECTOMY     VASECTOMY         Home Medications    Prior to Admission medications   Medication Sig Start Date End Date Taking? Authorizing Provider  benzonatate  (TESSALON ) 100 MG capsule Take 1 capsule (100 mg total) by mouth every 8 (eight) hours. 09/26/23  Yes Starlene Eaton, FNP  predniSONE (DELTASONE) 20 MG tablet Take 2 tablets (40 mg total) by mouth daily with breakfast for 5 days. 09/26/23 10/01/23 Yes Starlene Eaton, FNP  atorvastatin (LIPITOR) 40 MG tablet Take 40 mg by mouth daily.    [provider]  brimonidine (ALPHAGAN) 0.2 % ophthalmic solution Place 1 drop into both eyes 2 (two) times daily.    [provider]  Calcium Carb-Cholecalciferol (CALCIUM 600+D3) 600-800 MG-UNIT TABS Take 1 tablet by mouth daily.    [provider]  desloratadine (CLARINEX) 5 MG tablet Take 5 mg by mouth daily as needed (allergies).    [provider]  dorzolamide-timolol (COSOPT) 2-0.5 % ophthalmic solution Place 1 drop into both eyes 2 (two) times daily. 07/10/21   [provider]  escitalopram (LEXAPRO) 20 MG tablet Take 20  mg by mouth daily. 02/10/20   [provider]  hydrochlorothiazide (HYDRODIURIL) 25 MG tablet Take 25 mg by mouth daily.  02/09/14   [provider]  latanoprost (XALATAN) 0.005 % ophthalmic solution Place 1 drop into both eyes at bedtime.    [provider]  Multiple Vitamin (MULTIVITAMIN) tablet Take 1 tablet by mouth daily.    [provider]  olmesartan (BENICAR) 40 MG tablet Take 40 mg by mouth daily.    [provider]  pantoprazole (PROTONIX) 20 MG tablet Take 20 mg by mouth daily.    [provider]  Semaglutide ,0.25 or 0.5MG /DOS, (OZEMPIC , 0.25 OR 0.5  MG/DOSE,) 2 MG/3ML SOPN Inject 0.25 mg into the skin once a week. 01/28/23   [provider]  tamsulosin (FLOMAX) 0.4 MG CAPS capsule Take 0.4 mg by mouth at bedtime.    [provider]  vitamin C (ASCORBIC ACID) 500 MG tablet Take 500 mg by mouth daily.    [provider]  zaleplon  (SONATA ) 10 MG capsule Take 10 mg by mouth at bedtime as needed for sleep.    [provider]    Family History Family History  Problem Relation Age of Onset   Atrial fibrillation Mother    Heart failure Mother    Cancer - Lung Father    Prostate cancer Father    Arrhythmia Father    Hypertension Brother    Hyperlipidemia Brother     Social History Social History   Tobacco Use   Smoking status: Never   Smokeless tobacco: Never  Vaping Use   Vaping status: Former  Substance Use Topics   Alcohol use: Yes    Alcohol/week: 0.0 standard drinks of alcohol    Comment: OCC DRINKER   Drug use: No     Allergies   Patient has no known allergies.   Review of Systems Review of Systems  Respiratory:  Positive for cough.   Per HPI   Physical Exam Triage Vital Signs ED Triage Vitals  Encounter Vitals Group     BP 09/26/23 0852 (!) 142/72     Systolic BP Percentile --      Diastolic BP Percentile --      Pulse Rate 09/26/23 0849 (!) 57     Resp 09/26/23 0849 17     Temp 09/26/23 0849 97.8 F (36.6 C)     Temp Source 09/26/23 0849 Oral     SpO2 09/26/23 0849 96 %     Weight --      Height --      Head Circumference --      Peak Flow --      Pain Score 09/26/23 0851 0     Pain Loc --      Pain Education --      Exclude from Growth Chart --    No data found.  Updated Vital Signs BP (!) 142/72 (BP Location: Right Arm)   Pulse (!) 57   Temp 97.8 F (36.6 C) (Oral)   Resp 17   SpO2 96%   Visual Acuity Right Eye Distance:   Left Eye Distance:   Bilateral Distance:    Right Eye Near:   Left Eye Near:    Bilateral Near:     Physical Exam Vitals  and nursing note reviewed.  Constitutional:      Appearance: He is not ill-appearing or toxic-appearing.  HENT:     Head: Normocephalic and atraumatic.     Right Ear: Hearing,  tympanic membrane, ear canal and external ear normal.     Left Ear: Hearing, tympanic membrane, ear canal and external ear normal.     Nose: Nose normal.     Mouth/Throat:     Lips: Pink.     Mouth: Mucous membranes are moist. No injury or oral lesions.     Dentition: Normal dentition.     Tongue: No lesions.     Pharynx: Oropharynx is clear. Uvula midline. Posterior oropharyngeal erythema present. No pharyngeal swelling, oropharyngeal exudate, uvula swelling or postnasal drip.     Tonsils: No tonsillar exudate.     Comments: Mild erythema to posterior oropharynx with small amount of clear postnasal drainage visualized.  Eyes:     General: Lids are normal. Vision grossly intact. Gaze aligned appropriately.     Extraocular Movements: Extraocular movements intact.     Conjunctiva/sclera: Conjunctivae normal.     Pupils: Pupils are equal, round, and reactive to light.  Neck:     Trachea: Trachea and phonation normal.  Cardiovascular:     Rate and Rhythm: Normal rate and regular rhythm.     Heart sounds: Normal heart sounds, S1 normal and S2 normal.  Pulmonary:     Effort: Pulmonary effort is normal. No respiratory distress.     Breath sounds: Normal air entry. Wheezing and rhonchi present. No rales.     Comments: Expiratory wheezing heard to bilateral upper lung fields, rhonchi heard to bilateral lower lung fields. Speaking in full sentences without difficulty. Harsh and dry cough on exam.  Chest:     Chest wall: No tenderness.  Musculoskeletal:     Cervical back: Neck supple.     Right lower leg: No edema.     Left lower leg: No edema.  Lymphadenopathy:     Cervical: No cervical adenopathy.  Skin:    General: Skin is warm and dry.     Capillary Refill: Capillary refill takes less than 2 seconds.      Findings: No rash.  Neurological:     General: No focal deficit present.     Mental Status: He is alert and oriented to person, place, and time. Mental status is at baseline.     Cranial Nerves: No dysarthria or facial asymmetry.  Psychiatric:        Mood and Affect: Mood normal.        Speech: Speech normal.        Behavior: Behavior normal.        Thought Content: Thought content normal.        Judgment: Judgment normal.      UC Treatments / Results  Labs (all labs ordered are listed, but only abnormal results are displayed) Labs Reviewed - No data to display  EKG   Radiology No results found.  Procedures Procedures (including critical care time)  Medications Ordered in UC Medications  albuterol (VENTOLIN HFA) 108 (90 Base) MCG/ACT inhaler 2 puff (has no administration in time range)  AeroChamber Plus Flo-Vu Medium MISC 1 each (has no administration in time range)  ipratropium-albuterol (DUONEB) 0.5-2.5 (3) MG/3ML nebulizer solution 3 mL (3 mLs Nebulization Given 09/26/23 0904)  methylPREDNISolone sodium succinate (SOLU-MEDROL) 125 mg/2 mL injection 80 mg (80 mg Intramuscular Given 09/26/23 0903)    Initial Impression / Assessment and Plan / UC Course  I have reviewed the triage vital signs and the nursing notes.  Pertinent labs & imaging results that were available during my care of the patient were reviewed by me  and considered in my medical decision making (see chart for details).   1. Acute bronchitis, shortness of breath Evaluation suggests viral bronchitis, however chest x-ray ordered given clinical concern for underlying focal consolidation/pneumonia. He will have this performed at [facility] outpatient as we do not have imaging today in our clinic. Staff will call if chest x-ray is abnormal or if results require change in current treatment plan.   Interventions in clinic: Duoneb breathing treatment administered with improvement in lung sounds and  subjective shortness of breath on reassessment.  Wheezing and rhonchi remain, however cough has improved significantly after duoneb.  Albuterol 2 puffs every 4-6 hours on a schedule for the next 24 hours, then as needed for cough, shortness of breath, and wheezing.   Recommend treatment with steroid, bronchodilator, cough suppressants for symptomatic relief, and expectorants (mucinex) as needed- see AVS.    Counseled patient on potential for adverse effects with medications prescribed/recommended today, strict ER and return-to-clinic precautions discussed, patient verbalized understanding.    Final Clinical Impressions(s) / UC Diagnoses   Final diagnoses:  Acute bronchitis, unspecified organism  Shortness of breath     Discharge Instructions      Please go to med [facility] and have x-ray performed. Do not check in to the ER. Go to imaging department, get images performed, then go home. You will receive a phone call if the x-ray shows any abnormal results requiring further treatment. If the x-ray results do not change our treatment plan, you will not receive a phone call.  Also see these results on MyChart.  Med [facility] [address] [place], [place]  You have bronchitis which is inflammation of the upper airways in your lungs due to a virus.   We will treat this with steroids. Do not take any NSAIDs with steroid pills (no ibuprofen, naproxen while taking steroid, this could cause stomach upset).   Use albuterol 2 puffs every 4-6 hours on a schedule for the next 24 hours, then as needed for cough, shortness of breath, and wheezing.   Use guaifenesin (plain mucinex) to break up congestion in nose/chest so that you are able to excrete easier. Drink plenty of fluids to stay well hydrated while taking mucinex so that it works well in the body.   Tessalon  perles every 8 hours as needed for cough.  If you develop any new or worsening symptoms or if your  symptoms do not start to improve, please return here or follow-up with your primary care provider. If your symptoms are severe, please go to the emergency room.    ED Prescriptions     Medication Sig Dispense Auth. Provider   predniSONE (DELTASONE) 20 MG tablet Take 2 tablets (40 mg total) by mouth daily with breakfast for 5 days. 10 tablet Starlene Eaton, FNP   benzonatate  (TESSALON ) 100 MG capsule Take 1 capsule (100 mg total) by mouth every 8 (eight) hours. 21 capsule Starlene Eaton, FNP      PDMP not reviewed this encounter.   Shella Devoid [place], [place] 09/26/23 [phone]

## 2023-09-26 NOTE — ED Triage Notes (Signed)
 Pt c/o cough and congestion for 2 weeks.

## 2023-10-02 DIAGNOSIS — E785 Hyperlipidemia, unspecified: Secondary | ICD-10-CM | POA: Diagnosis not present

## 2023-10-02 DIAGNOSIS — R7303 Prediabetes: Secondary | ICD-10-CM | POA: Diagnosis not present

## 2023-10-13 DIAGNOSIS — Z6824 Body mass index (BMI) 24.0-24.9, adult: Secondary | ICD-10-CM | POA: Diagnosis not present

## 2023-10-13 DIAGNOSIS — R7303 Prediabetes: Secondary | ICD-10-CM | POA: Diagnosis not present

## 2023-10-13 DIAGNOSIS — Z0000 Encounter for general adult medical examination without abnormal findings: Secondary | ICD-10-CM | POA: Diagnosis not present

## 2023-10-13 DIAGNOSIS — Z860101 Personal history of adenomatous and serrated colon polyps: Secondary | ICD-10-CM | POA: Diagnosis not present

## 2023-10-13 DIAGNOSIS — K219 Gastro-esophageal reflux disease without esophagitis: Secondary | ICD-10-CM | POA: Diagnosis not present

## 2023-10-13 DIAGNOSIS — I10 Essential (primary) hypertension: Secondary | ICD-10-CM | POA: Diagnosis not present

## 2023-10-13 DIAGNOSIS — H409 Unspecified glaucoma: Secondary | ICD-10-CM | POA: Diagnosis not present

## 2023-10-13 DIAGNOSIS — Z79899 Other long term (current) drug therapy: Secondary | ICD-10-CM | POA: Diagnosis not present

## 2023-10-13 DIAGNOSIS — G479 Sleep disorder, unspecified: Secondary | ICD-10-CM | POA: Diagnosis not present

## 2023-10-13 DIAGNOSIS — F411 Generalized anxiety disorder: Secondary | ICD-10-CM | POA: Diagnosis not present

## 2023-10-13 DIAGNOSIS — J3089 Other allergic rhinitis: Secondary | ICD-10-CM | POA: Diagnosis not present

## 2023-10-13 DIAGNOSIS — E782 Mixed hyperlipidemia: Secondary | ICD-10-CM | POA: Diagnosis not present

## 2023-10-16 DIAGNOSIS — E65 Localized adiposity: Secondary | ICD-10-CM | POA: Diagnosis not present

## 2023-10-16 DIAGNOSIS — Z6823 Body mass index (BMI) 23.0-23.9, adult: Secondary | ICD-10-CM | POA: Diagnosis not present

## 2023-10-16 DIAGNOSIS — E782 Mixed hyperlipidemia: Secondary | ICD-10-CM | POA: Diagnosis not present

## 2023-10-16 DIAGNOSIS — R7303 Prediabetes: Secondary | ICD-10-CM | POA: Diagnosis not present

## 2023-10-16 DIAGNOSIS — I10 Essential (primary) hypertension: Secondary | ICD-10-CM | POA: Diagnosis not present

## 2023-10-29 DIAGNOSIS — L820 Inflamed seborrheic keratosis: Secondary | ICD-10-CM | POA: Diagnosis not present

## 2023-10-29 DIAGNOSIS — D2261 Melanocytic nevi of right upper limb, including shoulder: Secondary | ICD-10-CM | POA: Diagnosis not present

## 2023-10-29 DIAGNOSIS — D2262 Melanocytic nevi of left upper limb, including shoulder: Secondary | ICD-10-CM | POA: Diagnosis not present

## 2023-10-29 DIAGNOSIS — Z85828 Personal history of other malignant neoplasm of skin: Secondary | ICD-10-CM | POA: Diagnosis not present

## 2023-10-29 DIAGNOSIS — L570 Actinic keratosis: Secondary | ICD-10-CM | POA: Diagnosis not present

## 2023-10-29 DIAGNOSIS — L821 Other seborrheic keratosis: Secondary | ICD-10-CM | POA: Diagnosis not present

## 2023-10-29 DIAGNOSIS — D225 Melanocytic nevi of trunk: Secondary | ICD-10-CM | POA: Diagnosis not present

## 2023-11-13 DIAGNOSIS — R7303 Prediabetes: Secondary | ICD-10-CM | POA: Diagnosis not present

## 2023-11-13 DIAGNOSIS — E782 Mixed hyperlipidemia: Secondary | ICD-10-CM | POA: Diagnosis not present

## 2023-11-13 DIAGNOSIS — Z6824 Body mass index (BMI) 24.0-24.9, adult: Secondary | ICD-10-CM | POA: Diagnosis not present

## 2023-11-13 DIAGNOSIS — I10 Essential (primary) hypertension: Secondary | ICD-10-CM | POA: Diagnosis not present

## 2023-11-13 DIAGNOSIS — E65 Localized adiposity: Secondary | ICD-10-CM | POA: Diagnosis not present

## 2023-12-11 DIAGNOSIS — M5116 Intervertebral disc disorders with radiculopathy, lumbar region: Secondary | ICD-10-CM | POA: Diagnosis not present

## 2023-12-11 DIAGNOSIS — G8929 Other chronic pain: Secondary | ICD-10-CM | POA: Diagnosis not present

## 2023-12-11 DIAGNOSIS — M545 Low back pain, unspecified: Secondary | ICD-10-CM | POA: Diagnosis not present

## 2023-12-11 DIAGNOSIS — M4316 Spondylolisthesis, lumbar region: Secondary | ICD-10-CM | POA: Diagnosis not present

## 2023-12-11 DIAGNOSIS — M5416 Radiculopathy, lumbar region: Secondary | ICD-10-CM | POA: Diagnosis not present

## 2023-12-11 DIAGNOSIS — Z1330 Encounter for screening examination for mental health and behavioral disorders, unspecified: Secondary | ICD-10-CM | POA: Diagnosis not present

## 2023-12-16 DIAGNOSIS — H26492 Other secondary cataract, left eye: Secondary | ICD-10-CM | POA: Diagnosis not present

## 2023-12-16 DIAGNOSIS — Z961 Presence of intraocular lens: Secondary | ICD-10-CM | POA: Diagnosis not present

## 2023-12-16 DIAGNOSIS — H4089 Other specified glaucoma: Secondary | ICD-10-CM | POA: Diagnosis not present

## 2023-12-18 DIAGNOSIS — M4316 Spondylolisthesis, lumbar region: Secondary | ICD-10-CM | POA: Diagnosis not present

## 2023-12-18 DIAGNOSIS — M4807 Spinal stenosis, lumbosacral region: Secondary | ICD-10-CM | POA: Diagnosis not present

## 2023-12-18 DIAGNOSIS — M48061 Spinal stenosis, lumbar region without neurogenic claudication: Secondary | ICD-10-CM | POA: Diagnosis not present

## 2023-12-22 ENCOUNTER — Ambulatory Visit: Admitting: Cardiology

## 2024-01-01 ENCOUNTER — Ambulatory Visit: Attending: Cardiology | Admitting: Cardiology

## 2024-01-01 ENCOUNTER — Ambulatory Visit: Admitting: Cardiology

## 2024-01-01 ENCOUNTER — Encounter: Payer: Self-pay | Admitting: Cardiology

## 2024-01-01 VITALS — BP 124/70 | HR 63 | Ht 66.0 in | Wt 148.6 lb

## 2024-01-01 DIAGNOSIS — I1A0 Resistant hypertension: Secondary | ICD-10-CM

## 2024-01-01 DIAGNOSIS — E782 Mixed hyperlipidemia: Secondary | ICD-10-CM | POA: Insufficient documentation

## 2024-01-01 DIAGNOSIS — R7303 Prediabetes: Secondary | ICD-10-CM | POA: Diagnosis not present

## 2024-01-01 DIAGNOSIS — I10 Essential (primary) hypertension: Secondary | ICD-10-CM | POA: Insufficient documentation

## 2024-01-01 NOTE — Progress Notes (Signed)
 Cardiology Office Note:  .   Date:  01/04/2024  ID:  Austin Guerrero, DOB Aug 21, 1945, MRN [id number] PCP: Aisha Harvey, MD  [facility] Providers Cardiologist:  None     Chief Complaint  Patient presents with   Follow-up    Discussed cardiac risk factors   Hypertension    And hyperlipidemia    Patient Profile: .     Austin Guerrero is a healthy-appearing 78 y.o. male  with a PMH notable for HTN, HLD and Prostate Cancer (diagnosed 8 years ago), as well as Bilateral Moderate Stage Glaucoma who presents here for Reestablishment of Cardiology Care at the request of Aisha Harvey, MD.  who presents here for annual follow-up at the request of Aisha Harvey, MD.  Austin Guerrero was seen in the distant past back in 2015 by Dr. Shlomo for abnormal EKG and hypertension.  No follow-up visits since that time.  As noted during that visit that he really only had a family history of mother with pericardial effusion and CHF, and father with hypertension.  No premature CAD.  No cardiac symptoms.  EKG showed possible inferior lateral infarct pattern.  Evaluated with Echo and Myoview.  Both essentially normal.      Austin Guerrero was last seen on December 19, 2022 to reestablish cardiology care.  Was doing well.  Still exercising 5 days a week with 2 days doing light weight resistance training in 3 days doing cardio/recumbent bike.  Presently set an hour a day 5 days a week but also enjoys walking outside with it with his wife.  No cardiac symptoms.  BP stable on Benicar 40 mg and HCTZ..  Lipids acceptable with an LDL of 78 on 20 mg atorvastatin  Subjective  Discussed the use of AI scribe software for clinical note transcription with the patient, who gave verbal consent to proceed.  History of Present Illness Austin Guerrero is an 78 year old male who presents for a routine follow-up visit.  He recently traveled to [place], visiting locations such as the [employer],  [place], and [place]. During this trip, he experienced no heart racing, skipping, flipping, chest pain, pressure, tightness, or syncope. He managed the elevation by taking altitude sickness medication and maintaining hydration.  He is currently on atorvastatin 40 mg for hyperlipidemia, hydrochlorothiazide 25 mg, and Benicar 40 mg for hypertension. Additionally, he is taking Ozempic  0.5 mg for weight management, which he started with a wellness doctor. However, the wellness doctor has left the practice, leaving him uncertain about the future of his Ozempic  treatment.  He exercises daily and experiences no shortness of breath, rapid heartbeat, or dizziness during physical activity. He is concerned about the need for an echocardiogram and stress test due to his age and recent news of a peer's sudden death.  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   Cardiovascular ROS: no chest pain or dyspnea on exertion negative for - edema, irregular heartbeat, orthopnea, palpitations, paroxysmal nocturnal dyspnea, rapid heart rate, shortness of breath, or lightheadedness, near syncope/syncope, CVA/TIA/amaurosis fugax;, claudication  ROS:  Review of Systems - Negative except mild swelling on the airplane.    Objective     Studies Reviewed: SABRA   EKG Interpretation Date/Time:  Friday January 01 2024 09:23:23 EDT Ventricular Rate:  63 PR Interval:  198 QRS Duration:  92 QT Interval:  438 QTC Calculation: 448 R Axis:   77  Text Interpretation: Normal sinus rhythm  Septal infarct , age undetermined When compared with ECG of 19-Dec-2022 09:55, No significant change was found Confirmed by Anner Lenis ([id number]) on 01/01/2024 9:31:03 AM    Results LABS Total cholesterol: 169 (10/02/2023) HDL: 70 (10/02/2023) LDL: 80 (10/02/2023) Triglycerides: 101 (10/02/2023) A1c: 5.8 (10/02/2023) Creatinine: 0.89 (10/02/2023)  DIAGNOSTIC EKG: Normal  ECHO  (December 2015): Normal LV size and function.  EF 55 to 60%.  No RWMA.  GR 1 DD is normal for age.  Trivial AI and MR.  Otherwise normal echo. Myoview (December 2015): Normal study.  No ischemia or infarction   Risk Assessment/Calculations:          Physical Exam:   VS:  BP 124/70 (BP Location: Right Arm, Patient Position: Sitting, Cuff Size: Normal)   Pulse 63   Ht 5' 6 (1.676 m)   Wt 148 lb 9.6 oz (67.4 kg)   SpO2 98%   BMI 23.98 kg/m    Wt Readings from Last 3 Encounters:  01/01/24 148 lb 9.6 oz (67.4 kg)  02/16/23 146 lb (66.2 kg)  02/06/23 146 lb (66.2 kg)    Physical Exam GEN: Well nourished, well groomed in no acute distress; healthy-appearing.  NECK: No JVD; No carotid bruits CARDIAC: Normal S1, S2; RRR, no murmurs, rubs, gallops RESPIRATORY:  Clear to auscultation without rales, wheezing or rhonchi ; nonlabored, good air movement. ABDOMEN: Soft, non-tender, non-distended EXTREMITIES:  No clubbing/cyanosis or pedal edema.  Pedal pulses in place.; No deformity        ASSESSMENT AND PLAN: .    Problem List Items Addressed This Visit       Cardiology Problems   Hyperlipidemia (Chronic)   Lipid levels controlled with atorvastatin 40 mg. Total cholesterol 169, HDL 70, LDL 80, triglycerides 101. - Continue atorvastatin 40 mg daily.      Relevant Orders   EKG 12-Lead (Completed)   Primary hypertension - Primary (Chronic)   Blood pressure controlled with HCTZ 25 mg and Benicar 40 mg. - Continue current medication regimen.      Relevant Orders   EKG 12-Lead (Completed)     Other   Prediabetes (Chronic)   A1c 5.8, good control. On Ozempic  0.5 mg for weight management and prediabetes. Discussed maintenance dose and potential future discontinuation. Discussed potential transition to Rybelsus  for maintenance. - Continue Ozempic  0.5 mg. - Consider transitioning to Rybelsus  for maintenance if stable.           Follow-Up: Return for Routine follow up with  me.     Signed, Lenis MICAEL Anner, MD, MS Lenis Anner, M.D., M.S. Interventional Chartered certified accountant  Pager # [phone]

## 2024-01-01 NOTE — Patient Instructions (Signed)
 Medication Instructions:  Your physician recommends that you continue on your current medications as directed. Please refer to the Current Medication list given to you today.  *If you need a refill on your cardiac medications before your next appointment, please call your pharmacy*   Follow-Up: At [facility], you and your health needs are our priority.  As part of our continuing mission to provide you with exceptional heart care, our providers are all part of one team.  This team includes your primary Cardiologist (physician) and Advanced Practice Providers or APPs (Physician Assistants and Nurse Practitioners) who all work together to provide you with the care you need, when you need it.  Your next appointment:   6 month(s)  Provider:   Dr. Anner   We recommend signing up for the patient portal called MyChart.  Sign up information is provided on this After Visit Summary.  MyChart is used to connect with patients for Virtual Visits (Telemedicine).  Patients are able to view lab/test results, encounter notes, upcoming appointments, etc.  Non-urgent messages can be sent to your provider as well.   To learn more about what you can do with MyChart, go to [email or web address].

## 2024-01-04 ENCOUNTER — Encounter: Payer: Self-pay | Admitting: Cardiology

## 2024-01-04 DIAGNOSIS — R7303 Prediabetes: Secondary | ICD-10-CM | POA: Insufficient documentation

## 2024-01-04 NOTE — Assessment & Plan Note (Signed)
 Lipid levels controlled with atorvastatin 40 mg. Total cholesterol 169, HDL 70, LDL 80, triglycerides 101. - Continue atorvastatin 40 mg daily.

## 2024-01-04 NOTE — Assessment & Plan Note (Signed)
 A1c 5.8, good control. On Ozempic  0.5 mg for weight management and prediabetes. Discussed maintenance dose and potential future discontinuation. Discussed potential transition to Rybelsus  for maintenance. - Continue Ozempic  0.5 mg. - Consider transitioning to Rybelsus  for maintenance if stable.

## 2024-01-04 NOTE — Assessment & Plan Note (Signed)
 Blood pressure controlled with HCTZ 25 mg and Benicar 40 mg. - Continue current medication regimen.

## 2024-01-06 ENCOUNTER — Other Ambulatory Visit: Payer: Self-pay

## 2024-01-06 ENCOUNTER — Ambulatory Visit

## 2024-01-06 DIAGNOSIS — M5442 Lumbago with sciatica, left side: Secondary | ICD-10-CM | POA: Diagnosis not present

## 2024-01-06 DIAGNOSIS — M5441 Lumbago with sciatica, right side: Secondary | ICD-10-CM | POA: Diagnosis not present

## 2024-01-06 DIAGNOSIS — G8929 Other chronic pain: Secondary | ICD-10-CM | POA: Insufficient documentation

## 2024-01-06 NOTE — Therapy (Signed)
 OUTPATIENT PHYSICAL THERAPY THORACOLUMBAR EVALUATION   Patient Name: Austin Guerrero MRN: [id number] DOB:Nov 07, 1945, 78 y.o., male Today's Date: 01/06/2024  END OF SESSION:   Past Medical History:  Diagnosis Date   Allergic rhinitis    Anxiety    GERD (gastroesophageal reflux disease)    Glaucoma    Glaucoma    Hyperlipidemia    Hypertension    Prostate cancer (HCC)    Prostate cancer (HCC) 02/21/2015   Past Surgical History:  Procedure Laterality Date   EYE SURGERY     laser surgery for glaucoma  2016   FLEXIBLE SIGMOIDOSCOPY N/A 04/21/2017   Procedure: FLEXIBLE SIGMOIDOSCOPY;  Surgeon: Rosalie Kitchens, MD;  Location: [facility];  Service: Endoscopy;  Laterality: N/A;  RFA   INGUINAL HERNIA REPAIR Right 08/27/2015   Procedure: LAPAROSCOPIC RIGHT  INGUINAL HERNIA REPAIR;  Surgeon: Lynwood Pina, MD;  Location[facility];  Service: General;  Laterality: Right;   INGUINAL HERNIA REPAIR Left 02/16/2023   Procedure: OPEN LEFT INGUINAL HERNIA REPAIR WITH MESH;  Surgeon: Signe Mitzie LABOR, MD;  Location: [facility] ORS;  Service: General;  Laterality: Left;   INSERTION OF MESH Right 08/27/2015   Procedure: INSERTION OF MESH;  Surgeon: Lynwood Pina, MD;  Location: [facility] OR;  Service: General;  Laterality: Right;   TONSILLECTOMY     VASECTOMY     Patient Active Problem List   Diagnosis Date Noted   Prediabetes 01/04/2024   Prostate cancer (HCC)    Primary hypertension    Hyperlipidemia    GERD (gastroesophageal reflux disease)    Allergic rhinitis    Glaucoma     PCP: Lenora Harvey, MD  REFERRING PROVIDER: Bridgett Chew, PA  REFERRING DIAG: LBP with radiculopathy,  Rationale for Evaluation and Treatment: Rehabilitation  THERAPY DIAG:  No diagnosis found.  ONSET DATE: last time 6 weeks ago  SUBJECTIVE:                                                                                                                                                                                            SUBJECTIVE STATEMENT: I dont think I need much PT, I work out several times a week at [employer]. This pain just happens every so often, happened when on vacation 6 weeks ago and got better.  I just thought I should get it checked  PERTINENT HISTORY:  Intermittent bouts of locking, burning pain B legs, resolved with frequent standing lumbar flexion   PAIN:  Are you having pain? No none currently  PRECAUTIONS: None  RED FLAGS: None   WEIGHT BEARING RESTRICTIONS: No  FALLS:  Has patient fallen in last  6 months? No  LIVING ENVIRONMENT: Lives with: lives with their spouse Lives in: House/apartment Stairs: No Has following equipment at home: None  OCCUPATION: retired   PLOF: Independent  PATIENT GOALS: have other management techniques to prevent painful episodes back  NEXT MD VISIT: Friday 9/5  OBJECTIVE:  Note: Objective measures were completed at Evaluation unless otherwise noted.  DIAGNOSTIC FINDINGS:  Spinal stenosis mult levels, greatest L4/5  PATIENT SURVEYS:  Modified Oswestry:  MODIFIED OSWESTRY DISABILITY SCALE  Date: 01/06/24 Score  Pain intensity 0 = I can tolerate the pain I have without having to use pain medication.  2. Personal care (washing, dressing, etc.) 0 =  I can take care of myself normally without causing increased pain.  3. Lifting 0 = I can lift heavy weights without increased pain.  4. Walking 0 = Pain does not prevent me from walking any distance  5. Sitting 0 =  I can sit in any chair as long as I like.  6. Standing 0 =  I can stand as long as I want without increased pain.  7. Sleeping 0 = Pain does not prevent me from sleeping well.  8. Social Life 0 = My social life is normal and does not increase my pain.  9. Traveling 0 =  I can travel anywhere without increased pain.  10. Employment/ Homemaking 0 = My normal homemaking/job activities do not cause pain.  Total 0/50   Interpretation of scores: Score Category Description  0-20%  Minimal Disability The patient can cope with most living activities. Usually no treatment is indicated apart from advice on lifting, sitting and exercise  21-40% Moderate Disability The patient experiences more pain and difficulty with sitting, lifting and standing. Travel and social life are more difficult and they may be disabled from work. Personal care, sexual activity and sleeping are not grossly affected, and the patient can usually be managed by conservative means  41-60% Severe Disability Pain remains the main problem in this group, but activities of daily living are affected. These patients require a detailed investigation  61-80% Crippled Back pain impinges on all aspects of the patient's life. Positive intervention is required  81-100% Bed-bound  These patients are either bed-bound or exaggerating their symptoms  Bluford FORBES Zoe DELENA Karon DELENA, et al. Surgery versus conservative management of stable thoracolumbar fracture: the PRESTO feasibility RCT. [place]): [employer]; 2021 Nov. [facility] [employer] Assessment, No. 25.62.) Appendix 3, Oswestry Disability Index category descriptors. Available from: Find[employer]el[employer].cz  Minimally Clinically Important Difference (MCID) = 12.8%  COGNITION: Overall cognitive status: Within functional limits for tasks assessed     SENSATION: WFL  MUSCLE LENGTH: Hamstrings: Right -35 deg; Left -35 deg Thomas test: Right 0 deg; Left 0 deg  POSTURE: scoliotic changes with thoracic L concavity and R lumbar concavity and lat shift to R with pelvis  PALPATION: Non tender with PA glides lumbar region  LUMBAR ROM:   AROM eval  Flexion wfl  Extension 25  Right lateral flexion   Left lateral flexion   Right rotation wfl  Left rotation wfl   (Blank rows = not tested)  LOWER EXTREMITY ROM:  wnl    LUMBAR SPECIAL TESTS:  Straight leg raise test: Negative and FABER test: Negative  FUNCTIONAL TESTS:  30  seconds chair stand test  24 reps   GAIT: Distance walked: in clinic today, walks 2 miles 2 x week with wife  Assistive device utilized: None Level of assistance: Complete Independence  TREATMENT DATE: 01/06/24:                                                                                                                                Evaluation, education throughout, with spine model regarding spinal stenosis, provided with 2 ex to address hamstring tightness and LTR's to improve spine jt nutrition, mobility. Reviewed his routine at [employer].   PATIENT EDUCATION:  Education details: POC, goals Person educated: Patient Education method: Explanation, Demonstration, Tactile cues, Verbal cues, and Handouts Education comprehension: verbalized understanding  HOME EXERCISE PROGRAM: Access Code: [email or web address] URL: [email or web address] Date: 01/06/2024 Prepared by: Brandley Aldrete  Exercises - Supine Lower Trunk Rotation  - 1 x daily - 7 x weekly - 3 sets - 10 reps - Seated Hamstring Stretch with Strap  - 1 x daily - 7 x weekly - 3 sets - 10 reps  ASSESSMENT:  CLINICAL IMPRESSION: Patient is a 79 y.o. male who was seen today for physical therapy evaluation and treatment for lumbar radiculopathy.  He has recurring episodes of pain lumbar region, which may last for several hours to several days, not sure of provoking position or activity,  sometimes only occurs once every several months.  He has an excellent fitness regiment to address his core strength and cardiovascular fitness.  The largest findings today with evaluation were scoliotic changes lumbar and thoracic spine, and B hamstring flexibility restriction. His LE strength and lumbar ROM were wnl.  He attended today's session and will return to referring physician at the spine center at the end of this week.  Recommend today's evaluation only at this time, will reassess again if he develops more intensive Sx.  OBJECTIVE  IMPAIRMENTS: decreased knowledge of condition, impaired flexibility, and pain.   ACTIVITY LIMITATIONS: na  PARTICIPATION LIMITATIONS: school  PERSONAL FACTORS: 1 comorbidity: HTN are also affecting patient's functional outcome.   REHAB POTENTIAL: Good  CLINICAL DECISION MAKING: Stable/uncomplicated  EVALUATION COMPLEXITY: Low   GOALS: Goals reviewed with patient? Yes  SHORT TERM GOALS: Target date: today, 01/06/24  I HEP Baseline:instructed in 2 ex today to utilize in addition to his gym routine Goal status: INITIAL  PLAN:  PT FREQUENCY: one time visit  PT DURATION: 1 sessions  PLANNED INTERVENTIONS: 97110-Therapeutic exercises, 97530- Therapeutic activity, 97112- Neuromuscular re-education, 97535- Self Care, and 02859- Manual therapy.  PLAN FOR NEXT SESSION: na evaluation only   Damoni Causby L Grisel Blumenstock, PT, DPT, OCS 01/06/2024, 1:02 PM

## 2024-01-08 DIAGNOSIS — M5116 Intervertebral disc disorders with radiculopathy, lumbar region: Secondary | ICD-10-CM | POA: Diagnosis not present

## 2024-01-08 DIAGNOSIS — M48062 Spinal stenosis, lumbar region with neurogenic claudication: Secondary | ICD-10-CM | POA: Diagnosis not present

## 2024-01-08 DIAGNOSIS — M5416 Radiculopathy, lumbar region: Secondary | ICD-10-CM | POA: Diagnosis not present

## 2024-01-14 DIAGNOSIS — E65 Localized adiposity: Secondary | ICD-10-CM | POA: Diagnosis not present

## 2024-01-14 DIAGNOSIS — Z6823 Body mass index (BMI) 23.0-23.9, adult: Secondary | ICD-10-CM | POA: Diagnosis not present

## 2024-01-14 DIAGNOSIS — I10 Essential (primary) hypertension: Secondary | ICD-10-CM | POA: Diagnosis not present

## 2024-01-14 DIAGNOSIS — R7303 Prediabetes: Secondary | ICD-10-CM | POA: Diagnosis not present

## 2024-01-28 DIAGNOSIS — H4089 Other specified glaucoma: Secondary | ICD-10-CM | POA: Diagnosis not present

## 2024-04-07 DIAGNOSIS — R7303 Prediabetes: Secondary | ICD-10-CM | POA: Diagnosis not present
